# Patient Record
Sex: Male | Born: 1990 | Hispanic: Yes | Marital: Married | Smoking: Former smoker
Health system: Southern US, Community
[De-identification: ages and names within clinical notes are randomized; demographics above are authoritative.]

---
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
## 2010-07-20 ENCOUNTER — Emergency Department: Payer: Self-pay | Admitting: Emergency Medicine

## 2012-08-09 DIAGNOSIS — F32A Depression, unspecified: Secondary | ICD-10-CM | POA: Insufficient documentation

## 2016-01-02 ENCOUNTER — Emergency Department
Admission: EM | Admit: 2016-01-02 | Discharge: 2016-01-02 | Disposition: A | Discharge status: Home / Self Care | Payer: 59 | Attending: Emergency Medicine | Admitting: Emergency Medicine

## 2016-01-02 ENCOUNTER — Encounter: Payer: Self-pay | Admitting: Emergency Medicine

## 2016-01-02 ENCOUNTER — Emergency Department: Payer: 59

## 2016-01-02 DIAGNOSIS — F17200 Nicotine dependence, unspecified, uncomplicated: Secondary | ICD-10-CM | POA: Insufficient documentation

## 2016-01-02 DIAGNOSIS — R0789 Other chest pain: Secondary | ICD-10-CM | POA: Insufficient documentation

## 2016-01-02 DIAGNOSIS — R51 Headache: Secondary | ICD-10-CM | POA: Insufficient documentation

## 2016-01-02 DIAGNOSIS — R519 Headache, unspecified: Secondary | ICD-10-CM

## 2016-01-02 DIAGNOSIS — R079 Chest pain, unspecified: Secondary | ICD-10-CM

## 2016-01-02 LAB — CBC
HCT: 46.2 % (ref 40.0–52.0)
Hemoglobin: 15.3 g/dL (ref 13.0–18.0)
MCH: 30.4 pg (ref 26.0–34.0)
MCHC: 33.1 g/dL (ref 32.0–36.0)
MCV: 91.9 fL (ref 80.0–100.0)
Platelets: 248 10*3/uL (ref 150–440)
RBC: 5.02 MIL/uL (ref 4.40–5.90)
RDW: 13.5 % (ref 11.5–14.5)
WBC: 7 10*3/uL (ref 3.8–10.6)

## 2016-01-02 LAB — BASIC METABOLIC PANEL
ANION GAP: 9 (ref 5–15)
BUN: 16 mg/dL (ref 6–20)
CHLORIDE: 102 mmol/L (ref 101–111)
CO2: 28 mmol/L (ref 22–32)
CREATININE: 1.08 mg/dL (ref 0.61–1.24)
Calcium: 9.8 mg/dL (ref 8.9–10.3)
GFR calc non Af Amer: >60 mL/min (ref >60)
Glucose, Bld: 100 mg/dL — ABNORMAL HIGH (ref 65–99)
Potassium: 3.8 mmol/L (ref 3.5–5.1)
Sodium: 139 mmol/L (ref 135–145)

## 2016-01-02 LAB — TROPONIN I

## 2016-01-02 MED ORDER — IBUPROFEN 600 MG PO TABS: 600.0000 mg | ORAL_TABLET | Freq: Three times a day (TID) | ORAL | Status: DC | PRN

## 2016-01-02 MED ORDER — KETOROLAC TROMETHAMINE 60 MG/2ML IM SOLN
60.0000 mg | Freq: Once | INTRAMUSCULAR | Status: AC
  Administered 2016-01-02: 60 mg via INTRAMUSCULAR
  Filled 2016-01-02: qty 2

## 2016-01-02 NOTE — ED Notes (Addendum)
Pt states headache since Sunday, neck pain, and chest pain when he breaths that began yesterday. Denies fever, N&V, diarrhea. Pt states he has been congested recently, been treated for a sinus infection. Family states pt does not take any medicine for allergies.

## 2016-01-02 NOTE — ED Provider Notes (Signed)
[facility] Emergency Department Provider Note    ____________________________________________  Time seen: ~1955  I have reviewed the triage vital signs and the nursing notes.   HISTORY  Chief Complaint Chest Pain   History limited by: Not Limited   HPI Shawn Stevenson is a 25 y.o. male presents to the emergency department today because of concerns for headache and chest pain. Patient states he's been having a headache for the past 3-4 days. He describes as being located in bowel or back and top of his head. It is sharp. He has not tried any medication for his headache. He states that today he started noticing he was having some pain in his chest. He states it is worse when he takes a deep breath. He states that he has not had any fevers. No nausea or vomiting. No associated blurred vision.   History reviewed. No pertinent past medical history.  There are no active problems to display for this patient.   History reviewed. No pertinent past surgical history.  No current outpatient prescriptions on file.  Allergies Review of patient's allergies indicates no known allergies.  No family history on file.  Social History Social History  Substance Use Topics  . Smoking status: Current Some Day Smoker  . Smokeless tobacco: None  . Alcohol Use: No    Review of Systems  Constitutional: Negative for fever. Cardiovascular: Positive for chest pain Respiratory: Negative for shortness of breath. Gastrointestinal: Negative for abdominal pain, vomiting and diarrhea. Neurological: Positive for headache  10-point ROS otherwise negative.  ____________________________________________   PHYSICAL EXAM:  VITAL SIGNS: ED Triage Vitals  Enc Vitals Group     BP 01/02/16 1735 171/83 mmHg     Pulse Rate 01/02/16 1735 62     Resp 01/02/16 1735 20     Temp 01/02/16 1735 98.5 F (36.9 C)     Temp Source 01/02/16 1735 Oral     SpO2 01/02/16 1735 100 %      Weight 01/02/16 1735 195 lb (88.451 kg)     Height 01/02/16 1735 5\' 10"  (1.778 m)     Head Cir --      Peak Flow --      Pain Score 01/02/16 1736 7   Constitutional: Alert and oriented. Well appearing and in no distress. Eyes: Conjunctivae are normal. PERRL. Normal extraocular movements. ENT   Head: Normocephalic and atraumatic.   Nose: No congestion/rhinnorhea.   Mouth/Throat: Mucous membranes are moist.   Neck: No stridor. Hematological/Lymphatic/Immunilogical: No cervical lymphadenopathy. Cardiovascular: Normal rate, regular rhythm.  No murmurs, rubs, or gallops. Respiratory: Normal respiratory effort without tachypnea nor retractions. Breath sounds are clear and equal bilaterally. No wheezes/rales/rhonchi. Gastrointestinal: Soft and nontender. No distention.  Genitourinary: Deferred Musculoskeletal: Normal range of motion in all extremities. No joint effusions.  No lower extremity tenderness nor edema. Neurologic:  Normal speech and language. No gross focal neurologic deficits are appreciated.  Skin:  Skin is warm, dry and intact. No rash noted. Psychiatric: Mood and affect are normal. Speech and behavior are normal. Patient exhibits appropriate insight and judgment.  ____________________________________________    LABS (pertinent positives/negatives)  Labs Reviewed  BASIC METABOLIC PANEL - Abnormal; Notable for the following:    Glucose, Bld 100 (*)    All other components within normal limits  CBC  TROPONIN I   ____________________________________________  EKG  I, Nance Pear, attending physician, personally viewed and interpreted this EKG  EKG Time: 1732 Rate: 62 Rhythm: normal sinus rhythm Axis:  normal Intervals: qtc 401 QRS: Incomplete RBBB ST changes: no st elevation Impression: abnormal ekg ____________________________________________    RADIOLOGY  CXR  IMPRESSION: Negative. No active cardiopulmonary  disease.  ____________________________________________   PROCEDURES  Procedure(s) performed: None  Critical Care performed: No  ____________________________________________   INITIAL IMPRESSION / ASSESSMENT AND PLAN / ED COURSE  Pertinent labs & imaging results that were available during my care of the patient were reviewed by me and considered in my medical decision making (see chart for details).  Presented to the emergency department today primarily because of concerns for headache and some chest pain. On exam no focal neuro deficits. Patient had an on concerning EKG. Additionally blood work and chest x-ray without concerning findings. Did feel better after pain medication. At this point unclear etiology of the headache although I doubt an intracranial abscess, bleed or mass. However I did offer to perform head CT although patient declined at this time. I think this is reasonable given the lack of neuro symptoms. Additionally do not think chest pain represents ACS at this point. Will discharge and have patient follow-up with primary care.  ____________________________________________   FINAL CLINICAL IMPRESSION(S) / ED DIAGNOSES  Final diagnoses:  Headache, unspecified headache type  Chest pain, unspecified chest pain type     Nance Pear, MD 01/02/16 2117

## 2016-01-02 NOTE — ED Notes (Signed)
Pt presents with midsternal chest pain started today while at work. Denies any shortness of breath. Pt states is under a lot of stress.

## 2016-01-02 NOTE — Discharge Instructions (Signed)
Please seek medical attention for any high fevers, chest pain, shortness of breath, change in behavior, persistent vomiting, bloody stool or any other new or concerning symptoms. ° ° °General Headache Without Cause °A headache is pain or discomfort felt around the head or neck area. There are many causes and types of headaches. In some cases, the cause may not be found.  °HOME CARE  °Managing Pain °· Take over-the-counter and prescription medicines only as told by your doctor. °· Lie down in a dark, quiet room when you have a headache. °· If directed, apply ice to the head and neck area: °¨ Put ice in a plastic bag. °¨ Place a towel between your skin and the bag. °¨ Leave the ice on for 20 minutes, 2-3 times per day. °· Use a heating pad or hot shower to apply heat to the head and neck area as told by your doctor. °· Keep lights dim if bright lights bother you or make your headaches worse. °Eating and Drinking °· Eat meals on a regular schedule. °· Lessen how much alcohol you drink. °· Lessen how much caffeine you drink, or stop drinking caffeine. °General Instructions °· Keep all follow-up visits as told by your doctor. This is important. °· Keep a journal to find out if certain things bring on headaches. For example, write down: °¨ What you eat and drink. °¨ How much sleep you get. °¨ Any change to your diet or medicines. °· Relax by getting a massage or doing other relaxing activities. °· Lessen stress. °· Sit up straight. Do not tighten (tense) your muscles. °· Do not use tobacco products. This includes cigarettes, chewing tobacco, or e-cigarettes. If you need help quitting, ask your doctor. °· Exercise regularly as told by your doctor. °· Get enough sleep. This often means 7-9 hours of sleep. °GET HELP IF: °· Your symptoms are not helped by medicine. °· You have a headache that feels different than the other headaches. °· You feel sick to your stomach (nauseous) or you throw up (vomit). °· You have a  fever. °GET HELP RIGHT AWAY IF:  °· Your headache becomes really bad. °· You keep throwing up. °· You have a stiff neck. °· You have trouble seeing. °· You have trouble speaking. °· You have pain in the eye or ear. °· Your muscles are weak or you lose muscle control. °· You lose your balance or have trouble walking. °· You feel like you will pass out (faint) or you pass out. °· You have confusion. °  °This information is not intended to replace advice given to you by your health care provider. Make sure you discuss any questions you have with your health care provider. °  °Document Released: 05/19/2008 Document Revised: 05/01/2015 Document Reviewed: 12/03/2014 °Elsevier Interactive Patient Education ©2016 [employer] ° °

## 2016-10-26 IMAGING — CR DG CHEST 2V
1 series · 2 of 2 positions shown · non-contrast
Comparison: None.

CLINICAL DATA: Left-sided chest pain with inspiration. Shortness of
breath. Recent sinusitis.

EXAM:
CHEST  2 VIEW

[Series 1: w chest pa · 0.14mm/px · 2 of 2 slices shown]
[im 1/2]
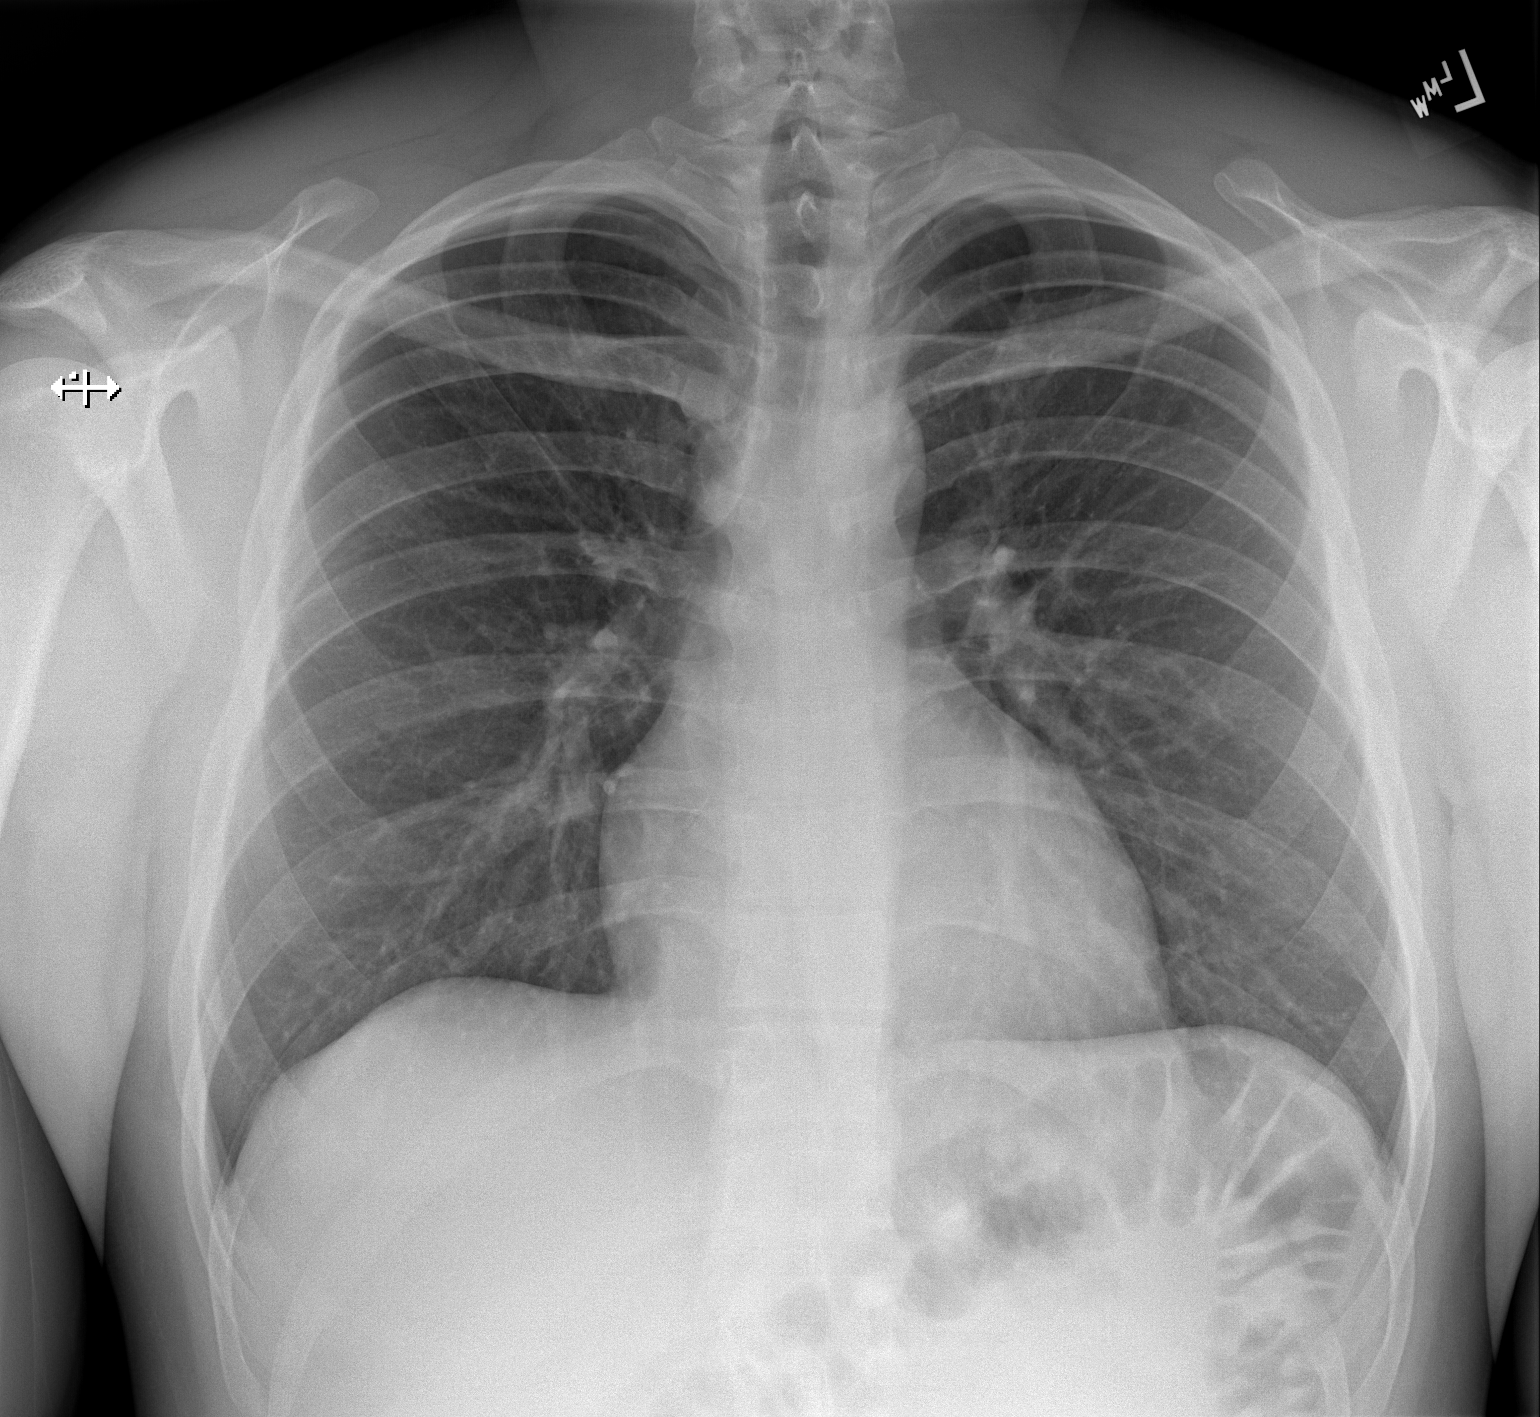
[im 2/2]
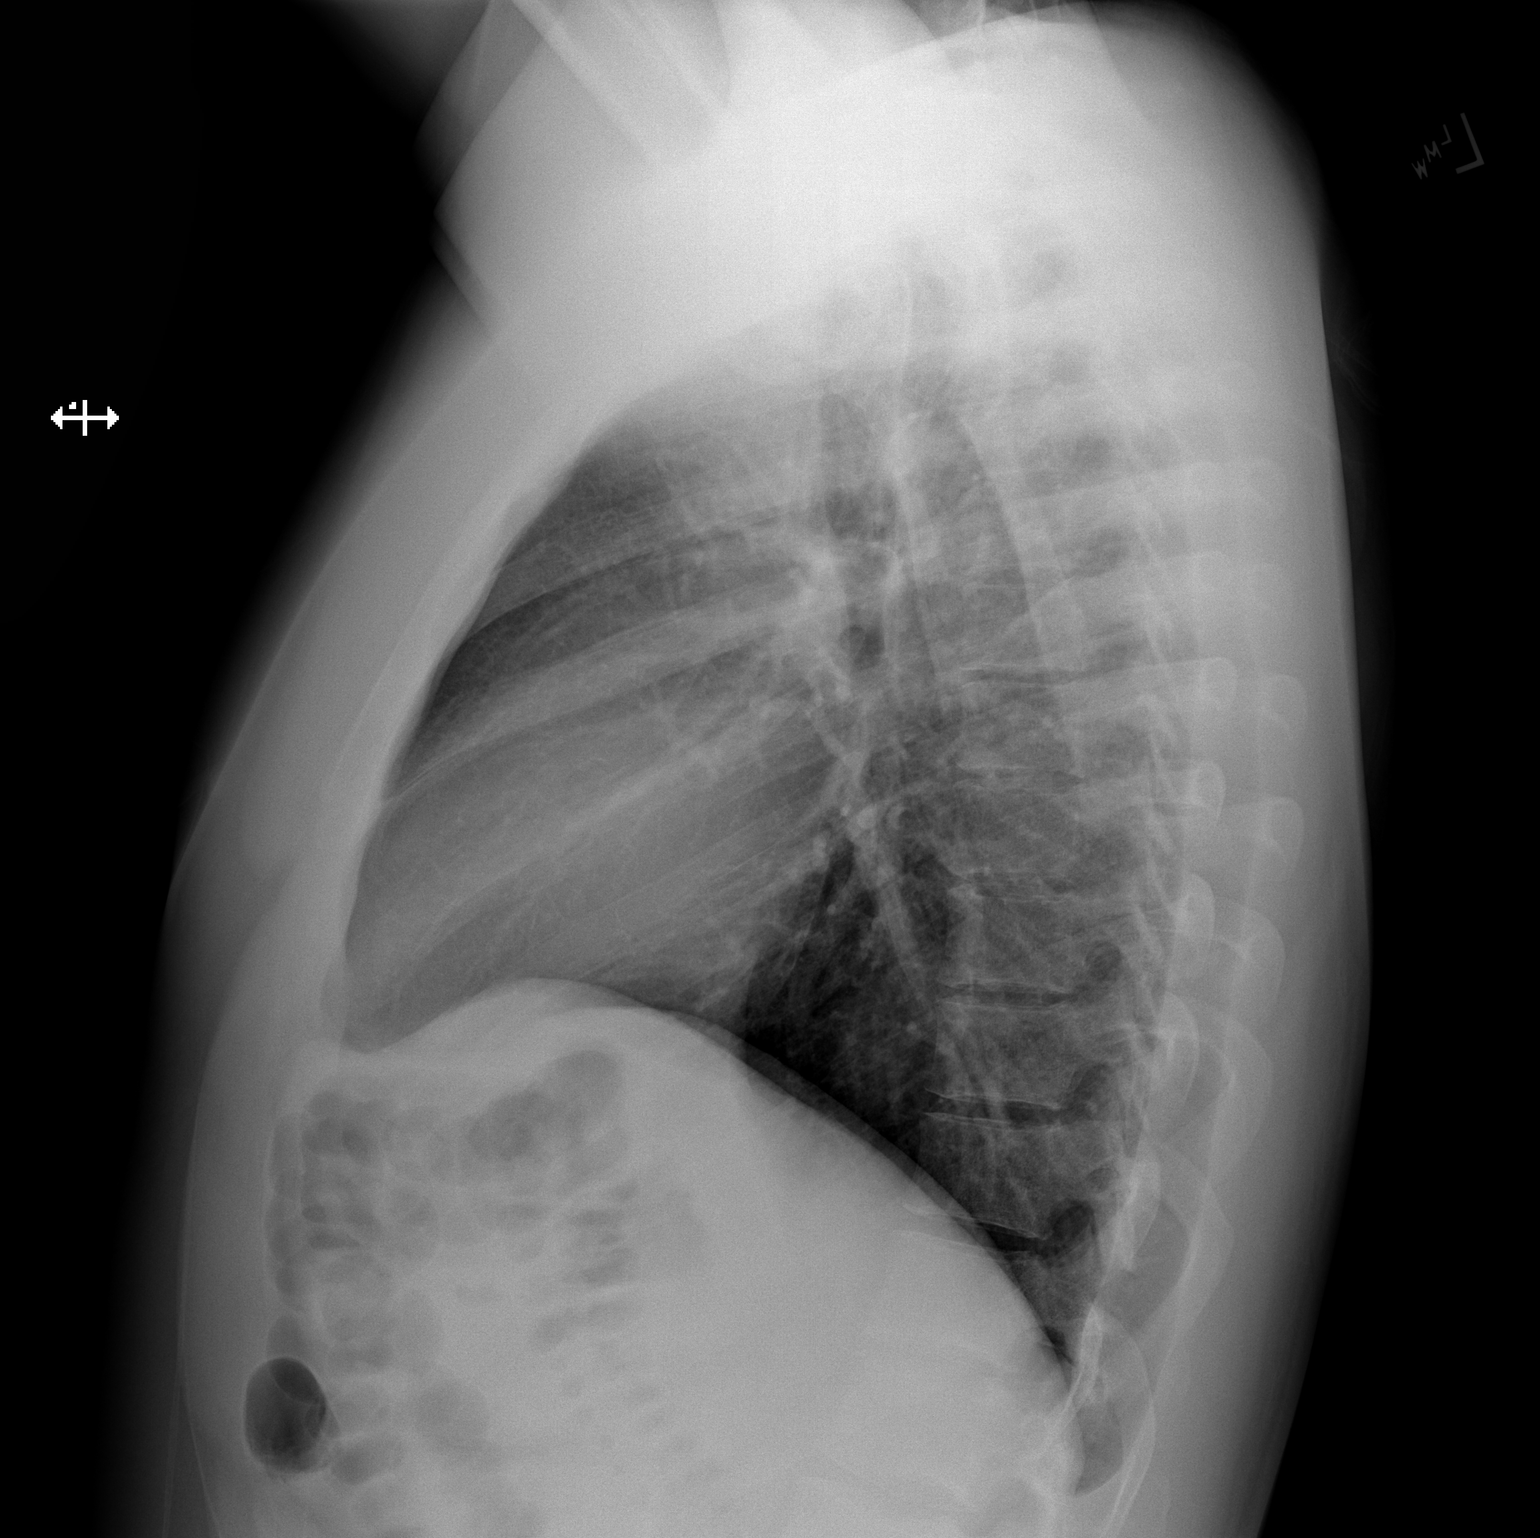

[2 of 2 positions shown; findings below may reference images not displayed]

FINDINGS: The heart size and mediastinal contours are within normal limits.
Both lungs are clear. The visualized skeletal structures are
unremarkable.
IMPRESSION: Negative.  No active cardiopulmonary disease.

## 2020-02-15 DIAGNOSIS — J36 Peritonsillar abscess: Secondary | ICD-10-CM | POA: Insufficient documentation

## 2021-09-08 ENCOUNTER — Emergency Department (HOSPITAL_COMMUNITY)
Admission: EM | Admit: 2021-09-08 | Discharge: 2021-09-08 | Discharge status: Against Medical Advice | Payer: 59 | Source: Home / Self Care

## 2021-10-23 ENCOUNTER — Emergency Department
Admission: EM | Admit: 2021-10-23 | Discharge: 2021-10-23 | Disposition: A | Discharge status: Home / Self Care | Payer: BC Managed Care – PPO | Attending: Emergency Medicine | Admitting: Emergency Medicine

## 2021-10-23 ENCOUNTER — Encounter: Payer: Self-pay | Admitting: Emergency Medicine

## 2021-10-23 ENCOUNTER — Emergency Department: Payer: BC Managed Care – PPO

## 2021-10-23 ENCOUNTER — Other Ambulatory Visit: Payer: Self-pay

## 2021-10-23 DIAGNOSIS — R059 Cough, unspecified: Secondary | ICD-10-CM | POA: Diagnosis present

## 2021-10-23 DIAGNOSIS — U071 COVID-19: Secondary | ICD-10-CM | POA: Insufficient documentation

## 2021-10-23 DIAGNOSIS — R112 Nausea with vomiting, unspecified: Secondary | ICD-10-CM | POA: Diagnosis not present

## 2021-10-23 DIAGNOSIS — R1011 Right upper quadrant pain: Secondary | ICD-10-CM | POA: Insufficient documentation

## 2021-10-23 LAB — URINALYSIS, ROUTINE W REFLEX MICROSCOPIC
Bilirubin Urine: NEGATIVE
Glucose, UA: NEGATIVE mg/dL
Ketones, ur: NEGATIVE mg/dL
Leukocytes,Ua: NEGATIVE
Nitrite: NEGATIVE
Protein, ur: 30 mg/dL — AB
Specific Gravity, Urine: 1.026 (ref 1.005–1.030)
pH: 6 (ref 5.0–8.0)

## 2021-10-23 LAB — COMPREHENSIVE METABOLIC PANEL
ALT: 46 U/L — ABNORMAL HIGH (ref 0–44)
AST: 32 U/L (ref 15–41)
Albumin: 4.8 g/dL (ref 3.5–5.0)
Alkaline Phosphatase: 80 U/L (ref 38–126)
Anion gap: 9 (ref 5–15)
BUN: 13 mg/dL (ref 6–20)
CO2: 26 mmol/L (ref 22–32)
Calcium: 9.7 mg/dL (ref 8.9–10.3)
Chloride: 101 mmol/L (ref 98–111)
Creatinine, Ser: 1.01 mg/dL (ref 0.61–1.24)
GFR, Estimated: >60 mL/min (ref >60)
Glucose, Bld: 111 mg/dL — ABNORMAL HIGH (ref 70–99)
Potassium: 3.9 mmol/L (ref 3.5–5.1)
Sodium: 136 mmol/L (ref 135–145)
Total Bilirubin: 1.4 mg/dL — ABNORMAL HIGH (ref 0.3–1.2)
Total Protein: 8.3 g/dL — ABNORMAL HIGH (ref 6.5–8.1)

## 2021-10-23 LAB — RESP PANEL BY RT-PCR (FLU A&B, COVID) ARPGX2
Influenza A by PCR: NEGATIVE
Influenza B by PCR: NEGATIVE
SARS Coronavirus 2 by RT PCR: POSITIVE — AB

## 2021-10-23 LAB — CBC
HCT: 50.2 % (ref 39.0–52.0)
Hemoglobin: 16.8 g/dL (ref 13.0–17.0)
MCH: 30.7 pg (ref 26.0–34.0)
MCHC: 33.5 g/dL (ref 30.0–36.0)
MCV: 91.8 fL (ref 80.0–100.0)
Platelets: 266 10*3/uL (ref 150–400)
RBC: 5.47 MIL/uL (ref 4.22–5.81)
RDW: 13.1 % (ref 11.5–15.5)
WBC: 8.1 10*3/uL (ref 4.0–10.5)
nRBC: 0 % (ref 0.0–0.2)

## 2021-10-23 LAB — LIPASE, BLOOD: Lipase: 35 U/L (ref 11–51)

## 2021-10-23 MED ORDER — ACETAMINOPHEN 500 MG PO TABS: 1000.0000 mg | ORAL_TABLET | Freq: Once | ORAL | Status: DC

## 2021-10-23 MED ORDER — ONDANSETRON HCL 4 MG/2ML IJ SOLN
4.0000 mg | Freq: Once | INTRAMUSCULAR | Status: AC
  Administered 2021-10-23: 4 mg via INTRAVENOUS
  Filled 2021-10-23: qty 2

## 2021-10-23 MED ORDER — ONDANSETRON 4 MG PO TBDP: 4.0000 mg | ORAL_TABLET | Freq: Three times a day (TID) | ORAL | 0 refills | Status: AC | PRN

## 2021-10-23 MED ORDER — SODIUM CHLORIDE 0.9 % IV BOLUS
1000.0000 mL | Freq: Once | INTRAVENOUS | Status: AC
  Administered 2021-10-23: 1000 mL via INTRAVENOUS

## 2021-10-23 MED ORDER — KETOROLAC TROMETHAMINE 15 MG/ML IJ SOLN
15.0000 mg | Freq: Once | INTRAMUSCULAR | Status: AC
  Administered 2021-10-23: 15 mg via INTRAVENOUS
  Filled 2021-10-23: qty 1

## 2021-10-23 MED ORDER — BENZONATATE 100 MG PO CAPS: 100.0000 mg | ORAL_CAPSULE | Freq: Three times a day (TID) | ORAL | 0 refills | Status: DC | PRN

## 2021-10-23 MED ORDER — IBUPROFEN 600 MG PO TABS: 600.0000 mg | ORAL_TABLET | Freq: Four times a day (QID) | ORAL | 0 refills | Status: AC | PRN

## 2021-10-23 MED ORDER — FENTANYL CITRATE PF 50 MCG/ML IJ SOSY
50.0000 ug | PREFILLED_SYRINGE | Freq: Once | INTRAMUSCULAR | Status: AC
  Administered 2021-10-23: 50 ug via INTRAVENOUS
  Filled 2021-10-23: qty 1

## 2021-10-23 NOTE — ED Notes (Signed)
See triage note  presents with low grade fever,body aches and n/v  states sx's started yesterday ?

## 2021-10-23 NOTE — ED Triage Notes (Signed)
Pt here with abd pain and vomiting since yesterday. Pt also reports weakness. Pt stable in triage, ?

## 2021-10-23 NOTE — ED Provider Notes (Signed)
? ?[facility] ?Provider Note ? ? ? Event Date/Time  ? First MD Initiated Contact with Patient 10/23/21 1150   ?  (approximate) ? ? ?History  ? ?Abdominal Pain and Nausea ? ? ?HPI ? ?Shawn Stevenson is a 31 y.o. male who is otherwise healthy has not been feeling well for the past 2 to 3 days.  He reports having nausea, vomiting, upper abdominal pain, congestion, coughing.  He reports that one of his family members has also been sick.  He denies any lower abdominal pain.  He had some decreased p.o. intake secondary to not feeling well.  He does report being COVID vaccinated. ? ?Physical Exam  ? ?Triage Vital Signs: ?ED Triage Vitals [10/23/21 1112]  ?Enc Vitals Group  ?   BP (!) 140/98  ?   Pulse Rate 99  ?   Resp 18  ?   Temp 99.5 ?F (37.5 ?C)  ?   Temp Source Oral  ?   SpO2 96 %  ?   Weight 210 lb (95.3 kg)  ?   Height 6' (1.829 m)  ?   Head Circumference   ?   Peak Flow   ?   Pain Score 8  ?   Pain Loc   ?   Pain Edu?   ?   Excl. in [place]?   ? ? ?Most recent vital signs: ?Vitals:  ? 10/23/21 1112  ?BP: (!) 140/98  ?Pulse: 99  ?Resp: 18  ?Temp: 99.5 ?F (37.5 ?C)  ?SpO2: 96%  ? ? ? ?General: Awake, no distress.  ?CV:  Good peripheral perfusion.  ?Resp:  Normal effort.  ?Abd:  No distention.  No lower abdominal tenderness.  Some mild upper abdominal tenderness ? ? ? ?ED Results / Procedures / Treatments  ? ?Labs ?(all labs ordered are listed, but only abnormal results are displayed) ?Labs Reviewed  ?COMPREHENSIVE METABOLIC PANEL - Abnormal; Notable for the following components:  ?    Result Value  ? Glucose, Bld 111 (*)   ? Total Protein 8.3 (*)   ? ALT 46 (*)   ? Total Bilirubin 1.4 (*)   ? All other components within normal limits  ?URINALYSIS, ROUTINE W REFLEX MICROSCOPIC - Abnormal; Notable for the following components:  ? Color, Urine YELLOW (*)   ? APPearance CLEAR (*)   ? Hgb urine dipstick SMALL (*)   ? Protein, ur 30 (*)   ? Bacteria, UA RARE (*)   ? All other components within normal limits   ?RESP PANEL BY RT-PCR (FLU A&B, COVID) ARPGX2  ?LIPASE, BLOOD  ?CBC  ? ?RADIOLOGY ?I have reviewed the xray personally and no pna ? ?[place] pending rads read ? ? ?PROCEDURES: ? ?Critical Care performed: No ? ?Procedures ? ? ?MEDICATIONS ORDERED IN ED: ?Medications  ?sodium chloride 0.9 % bolus 1,000 mL (has no administration in time range)  ?fentaNYL (SUBLIMAZE) injection 50 mcg (has no administration in time range)  ?ondansetron (ZOFRAN) injection 4 mg (has no administration in time range)  ?ondansetron (ZOFRAN) injection 4 mg (4 mg Intravenous Given 10/23/21 1121)  ? ? ? ?IMPRESSION / MDM / ASSESSMENT AND PLAN / ED COURSE  ?I reviewed the triage vital signs and the nursing notes. ?             ?               ? ?Differential diagnosis includes, but is not limited to, most likely viral illness but given  patient does report some upper abdominal pain also get ultrasound to make sure no evidence of cholecystitis.  Chest x-ray to evaluate for pneumonia.  Patient was given 1 L of fluid and IV Zofran to help with symptoms. ? ?CMP does show slightly elevated total bili and ALT.  He reports occasional alcohol use.  Ultrasound without evidence of gallstones.  Does have some hepatic steatosis which is most likely the cause of this.  Recommended follow-up with his primary care doctor for recheck and he expressed understanding and to limit alcohol use and weight loss. ? ?COVID test was positive.  CBC normal.  Lipase normal ? ?On repeat evaluation patient feeling much better.  Ultrasound with some incidental findings but I provided a copy of report to get follow-up they expressed understanding. ? ?I explained the patient was COVID-positive and most likely cause of his symptoms.  We discussed Paxlovid treatment but given otherwise young and healthy they have declined.  We discussed symptomatic treatment with Zofran, ibuprofen, Tessalon Perles.  They feel comfortable with discharge home and will return if symptoms are  worsening ? ? ?FINAL CLINICAL IMPRESSION(S) / ED DIAGNOSES  ? ?Final diagnoses:  ?RUQ pain  ?COVID-19  ? ? ? ?Rx / DC Orders  ? ?ED Discharge Orders   ? ?      Ordered  ?  benzonatate (TESSALON PERLES) 100 MG capsule  3 times daily PRN       ? 10/23/21 1425  ?  ondansetron (ZOFRAN-ODT) 4 MG disintegrating tablet  Every 8 hours PRN       ? 10/23/21 1425  ?  ibuprofen (ADVIL) 600 MG tablet  Every 6 hours PRN       ? 10/23/21 1425  ? ?  ?  ? ?  ? ? ? ?Note:  This document was prepared using Dragon voice recognition software and may include unintentional dictation errors. ?  ?Vanessa Frederick, MD ?10/23/21 1427 ? ?

## 2021-10-23 NOTE — Discharge Instructions (Addendum)
Drink Pedialyte, Gatorade without sugar to help stay hydrated.  Take medications to help with symptoms and return to the ER for worsening pain or any other concerns ? ?IMPRESSION:  ?Probable hepatic steatosis.  2.1 cm septated right hepatic cyst.  ?   ?7 mm gallbladder polyp is noted. Follow-up ultrasound in 1 year is  ?recommended to ensure stability.  ?   ? ?

## 2022-02-10 ENCOUNTER — Encounter: Payer: Self-pay | Admitting: Emergency Medicine

## 2022-02-10 ENCOUNTER — Emergency Department
Admission: EM | Admit: 2022-02-10 | Discharge: 2022-02-10 | Disposition: A | Discharge status: Home / Self Care | Payer: BC Managed Care – PPO | Attending: Emergency Medicine | Admitting: Emergency Medicine

## 2022-02-10 ENCOUNTER — Other Ambulatory Visit: Payer: Self-pay

## 2022-02-10 ENCOUNTER — Emergency Department: Payer: BC Managed Care – PPO

## 2022-02-10 DIAGNOSIS — K625 Hemorrhage of anus and rectum: Secondary | ICD-10-CM | POA: Insufficient documentation

## 2022-02-10 DIAGNOSIS — R5383 Other fatigue: Secondary | ICD-10-CM | POA: Diagnosis not present

## 2022-02-10 DIAGNOSIS — N281 Cyst of kidney, acquired: Secondary | ICD-10-CM | POA: Diagnosis not present

## 2022-02-10 DIAGNOSIS — R1013 Epigastric pain: Secondary | ICD-10-CM | POA: Diagnosis present

## 2022-02-10 DIAGNOSIS — R1084 Generalized abdominal pain: Secondary | ICD-10-CM

## 2022-02-10 LAB — CBC WITH DIFFERENTIAL/PLATELET
Abs Immature Granulocytes: 0.03 10*3/uL (ref 0.00–0.07)
Basophils Absolute: 0.1 10*3/uL (ref 0.0–0.1)
Basophils Relative: 1 %
Eosinophils Absolute: 0.1 10*3/uL (ref 0.0–0.5)
Eosinophils Relative: 2 %
HCT: 50.2 % (ref 39.0–52.0)
Hemoglobin: 16.4 g/dL (ref 13.0–17.0)
Immature Granulocytes: 0 %
Lymphocytes Relative: 41 %
Lymphs Abs: 3 10*3/uL (ref 0.7–4.0)
MCH: 30.2 pg (ref 26.0–34.0)
MCHC: 32.7 g/dL (ref 30.0–36.0)
MCV: 92.4 fL (ref 80.0–100.0)
Monocytes Absolute: 0.5 10*3/uL (ref 0.1–1.0)
Monocytes Relative: 7 %
Neutro Abs: 3.7 10*3/uL (ref 1.7–7.7)
Neutrophils Relative %: 49 %
Platelets: 291 10*3/uL (ref 150–400)
RBC: 5.43 MIL/uL (ref 4.22–5.81)
RDW: 13 % (ref 11.5–15.5)
WBC: 7.5 10*3/uL (ref 4.0–10.5)
nRBC: 0 % (ref 0.0–0.2)

## 2022-02-10 LAB — COMPREHENSIVE METABOLIC PANEL
ALT: 51 U/L — ABNORMAL HIGH (ref 0–44)
AST: 30 U/L (ref 15–41)
Albumin: 4.4 g/dL (ref 3.5–5.0)
Alkaline Phosphatase: 67 U/L (ref 38–126)
Anion gap: 6 (ref 5–15)
BUN: 13 mg/dL (ref 6–20)
CO2: 29 mmol/L (ref 22–32)
Calcium: 9.7 mg/dL (ref 8.9–10.3)
Chloride: 105 mmol/L (ref 98–111)
Creatinine, Ser: 0.91 mg/dL (ref 0.61–1.24)
GFR, Estimated: >60 mL/min (ref >60)
Glucose, Bld: 111 mg/dL — ABNORMAL HIGH (ref 70–99)
Potassium: 4.7 mmol/L (ref 3.5–5.1)
Sodium: 140 mmol/L (ref 135–145)
Total Bilirubin: 1.2 mg/dL (ref 0.3–1.2)
Total Protein: 7.4 g/dL (ref 6.5–8.1)

## 2022-02-10 LAB — TYPE AND SCREEN
ABO/RH(D): A POS
Antibody Screen: NEGATIVE

## 2022-02-10 MED ORDER — IOHEXOL 300 MG/ML  SOLN
100.0000 mL | Freq: Once | INTRAMUSCULAR | Status: AC | PRN
Start: 2022-02-10 — End: 2022-02-10
  Administered 2022-02-10: 100 mL via INTRAVENOUS

## 2022-02-10 NOTE — ED Triage Notes (Signed)
Pt here with abd pain and a GI bleed x3 weeks. Pt states his symptoms became worse yesterday when he noticed bright red blood. Pt had 1 episode of emesis this morning.

## 2022-02-10 NOTE — ED Provider Notes (Signed)
[facility] Provider Note    Event Date/Time   First MD Initiated Contact with Patient 02/10/22 1227     (approximate)   History   Abdominal Pain   HPI  Shawn Stevenson is a 31 y.o. male who presents today for evaluation of epigastric pain and bright red blood per rectum.  Patient was initially seen at [facility] clinic and sent him to the emergency department for evaluation.  Patient reports that he has blood in his stool intermittently for the past 3 weeks, only when wiping.  He estimates about a teaspoon of bright red blood.  He reports that it is usually mixed in with the stool.  He reports that he has pain in his abdomen that he rates 6 out of 10.  He reports that he has been feeling fatigued as well.  He denies any pain when moving his bowels.  He has not had any fevers or chills.  No personal or family history of GI problems or colon cancer.  Patient reports that he has been taking Pepto-Bismol.  There are no problems to display for this patient.         Physical Exam   Triage Vital Signs: ED Triage Vitals [02/10/22 1122]  Enc Vitals Group     BP (!) 160/105     Pulse Rate (!) 57     Resp 18     Temp 98.2 F (36.8 C)     Temp Source Oral     SpO2 98 %     Weight 236 lb (107 kg)     Height '5\' 11"'$  (1.803 m)     Head Circumference      Peak Flow      Pain Score 7     Pain Loc      Pain Edu?      Excl. in [place]?     Most recent vital signs: Vitals:   02/10/22 1236 02/10/22 1456  BP: (!) 150/99 (!) 148/88  Pulse: 60 67  Resp: 18 18  Temp:    SpO2: 98% 98%    Physical Exam Vitals and nursing note reviewed.  Constitutional:      General: Awake and alert. No acute distress.    Appearance: Normal appearance. He is well-developed and normal weight.  HENT:     Head: Normocephalic and atraumatic.     Mouth: Mucous membranes are moist.  Eyes:     General: PERRL. Normal EOMs        Right eye: No discharge.        Left eye: No discharge.      Conjunctiva/sclera: Conjunctivae normal.  Cardiovascular:     Rate and Rhythm: Normal rate and regular rhythm.     Pulses: Normal pulses.     Heart sounds: Normal heart sounds Pulmonary:     Effort: Pulmonary effort is normal. No respiratory distress.     Breath sounds: Normal breath sounds.  Abdominal:     Abdomen is soft. There is mild periumbilical, lower quadrant, and right lower quadrant abdominal tenderness. No rebound or guarding. No distention. Rectal: Scant stool in rectal vault.  No hemorrhoids noted.  Guaiac negative Musculoskeletal:        General: No swelling. Normal range of motion.     Cervical back: Normal range of motion and neck supple.  Skin:    General: Skin is warm and dry.     Capillary Refill: Capillary refill takes less than 2 seconds.  Findings: No rash.  Neurological:     Mental Status: He is alert.      ED Results / Procedures / Treatments   Labs (all labs ordered are listed, but only abnormal results are displayed) Labs Reviewed  COMPREHENSIVE METABOLIC PANEL - Abnormal; Notable for the following components:      Result Value   Glucose, Bld 111 (*)    ALT 51 (*)    All other components within normal limits  CBC WITH DIFFERENTIAL/PLATELET  TYPE AND SCREEN     EKG     RADIOLOGY IMPRESSION: 1. No acute CT findings of the abdomen or pelvis to explain abdominal pain or bright red blood per rectum. 2. Numerous bilateral renal cysts of varying sizes, largest of which are clearly simple fluid attenuation, others too small to confidently characterize although almost certainly additional benign cysts. Although benign, number of cysts at patient age suggests adult polycystic kidney disease.    PROCEDURES:  Critical Care performed:   Procedures   MEDICATIONS ORDERED IN ED: Medications  iohexol (OMNIPAQUE) 300 MG/ML solution 100 mL (100 mLs Intravenous Contrast Given 02/10/22 1400)     IMPRESSION / MDM / ASSESSMENT AND PLAN / ED  COURSE  I reviewed the triage vital signs and the nursing notes.   Differential diagnosis includes, but is not limited to, GI bleed, hemorrhoids, gastroenteritis.  Patient presents emergency department awake and alert, hemodynamically stable and afebrile.  Patient has no known history of familial polyposis or other colon cancer syndromes. Labs obtained in triage demonstrate a stable H&H, normal LFTs and lipase, normal coagulation cascade.  CT abdomen and pelvis also obtained and demonstrates no obvious cause of his belly pain and bright red blood per rectum.  However incidentally, numerous bilateral renal cysts of various sizes were seen, suggesting possible adult polycystic kidney disease as interpreted by the radiologist.  I discussed these findings with the patient, he reports that he has been told that he has kidney cysts before.  I recommended that he follow-up with nephrology, and the appropriate information was provided.  Regarding his abdominal pain and his bright red blood per rectum, I recommended that he have a colonoscopy and he was given the information for gastroenterology.  Given that he has had stable vital signs, normal H&H, no indication for emergent colonoscopy.  However, I do feel that he should have this in the very near future, and he agrees to call tomorrow.  In the meantime, we did discuss strict return precautions.  Patient and wife understand and agree with plan.  He was discharged in stable condition.    Patient's presentation is most consistent with acute presentation with potential threat to life or bodily function.       FINAL CLINICAL IMPRESSION(S) / ED DIAGNOSES   Final diagnoses:  Generalized abdominal pain  BRBPR (bright red blood per rectum)  Bilateral renal cysts     Rx / DC Orders   ED Discharge Orders     None        Note:  This document was prepared using Dragon voice recognition software and may include unintentional dictation errors.   Emeline Gins 02/10/22 Kerrie Pleasure, MD 02/11/22 [phone]

## 2022-02-10 NOTE — Discharge Instructions (Signed)
Your blood work and CT imaging did not show any causes of your GI bleeding today.  As we discussed, you do have cysts on your kidneys.  Please follow-up with the phone numbers above to schedule a colonoscopy which be the next step for your GI bleeding, and please schedule an appointment with the nephrologist for the cysts on your kidneys.  Please return the emergency department immediately for any new, worsening, or change in symptoms or other concerns.  It was a pleasure caring for you today.

## 2022-02-26 ENCOUNTER — Other Ambulatory Visit: Payer: Self-pay

## 2022-02-26 ENCOUNTER — Encounter: Payer: Self-pay | Admitting: Gastroenterology

## 2022-02-26 ENCOUNTER — Ambulatory Visit (INDEPENDENT_AMBULATORY_CARE_PROVIDER_SITE_OTHER): Payer: BC Managed Care – PPO | Admitting: Gastroenterology

## 2022-02-26 VITALS — BP 165/103 | HR 73 | Temp 98.3°F | Ht 72.0 in | Wt 225.4 lb

## 2022-02-26 DIAGNOSIS — R1084 Generalized abdominal pain: Secondary | ICD-10-CM

## 2022-02-26 DIAGNOSIS — K625 Hemorrhage of anus and rectum: Secondary | ICD-10-CM | POA: Diagnosis not present

## 2022-02-26 MED ORDER — OMEPRAZOLE 40 MG PO CPDR: 40.0000 mg | DELAYED_RELEASE_CAPSULE | Freq: Two times a day (BID) | ORAL | 0 refills | Status: AC

## 2022-02-26 MED ORDER — NA SULFATE-K SULFATE-MG SULF 17.5-3.13-1.6 GM/177ML PO SOLN: 354.0000 mL | Freq: Once | ORAL | 0 refills | Status: AC

## 2022-02-26 NOTE — Progress Notes (Signed)
Jonathon Bellows MD, MRCP(U.K) [address]  [place]  [place], [place] [address]  Main: [phone]  Fax: [phone]   Gastroenterology Consultation  Referring Provider:     [facility] Primary Care Physician:  [facility] Primary Gastroenterologist:  Dr. Jonathon Bellows  Reason for Consultation:     Abdominal pain        HPI:   Shawn Stevenson is a 31 y.o. y/o male referred for abdominal pain  Recent visit to the ER on 02/10/2022 with abdominal pain ongoing along with bright red blood per rectum only when wiping a CT scan of the abdomen showed bilateral renal cysts of various sizes suggestive of adult polycystic kidney disease otherwise no acute findings advised to follow-up with gastroenterology his hemoglobin was 16.4 g with a CMP showed no abnormalities except an elevated sugar of 111.  For a few weeks has been having bright red blood per rectum every time he has a bowel movement.  No pain.  No NSAID use.  No joint pains no skin rashes no family history of IBD.  Upper abdominal discomfort when he eats.  Right after he eats.  Denies any NSAID use.  Not tried a PPI.  No unintentional weight loss.  No prior GI evaluation.  No past medical history on file.  No past surgical history on file.  Prior to Admission medications   Medication Sig Start Date End Date Taking? Authorizing Provider  benzonatate (TESSALON PERLES) 100 MG capsule Take 1 capsule (100 mg total) by mouth 3 (three) times daily as needed for cough. 10/23/21 10/23/22  Vanessa Port Colden, MD  citalopram (CELEXA) 20 MG tablet Take 1 tablet by mouth daily. 02/16/22 02/16/23  [provider]    No family history on file.   Social History   Tobacco Use   Smoking status: Some Days  Vaping Use   Vaping Use: Never used  Substance Use Topics   Alcohol use: No   Drug use: Never    Allergies as of 02/26/2022 - Review Complete 02/26/2022  Allergen Reaction Noted   Other  01/02/2016    Review  of Systems:    All systems reviewed and negative except where noted in HPI.   Physical Exam:  BP (!) 165/103   Pulse 73   Temp 98.3 F (36.8 C) (Oral)   Ht 6' (1.829 m)   Wt 225 lb 6.4 oz (102.2 kg)   BMI 30.57 kg/m  No LMP for male patient. Psych:  Alert and cooperative. Normal mood and affect. General:   Alert,  Well-developed, well-nourished, pleasant and cooperative in NAD Head:  Normocephalic and atraumatic. Eyes:  Sclera clear, no icterus.   Conjunctiva pink. Neurologic:  Alert and oriented x3;  grossly normal neurologically. Psych:  Alert and cooperative. Normal mood and affect.  Imaging Studies: CT ABDOMEN PELVIS W CONTRAST  Result Date: 02/10/2022 CLINICAL DATA:  Abdominal pain, bright red blood per rectum for 3 weeks EXAM: CT ABDOMEN AND PELVIS WITH CONTRAST TECHNIQUE: Multidetector CT imaging of the abdomen and pelvis was performed using the standard protocol following bolus administration of intravenous contrast. RADIATION DOSE REDUCTION: This exam was performed according to the departmental dose-optimization program which includes automated exposure control, adjustment of the mA and/or kV according to patient size and/or use of iterative reconstruction technique. CONTRAST:  156m OMNIPAQUE IOHEXOL 300 MG/ML  SOLN COMPARISON:  None Available. FINDINGS: Lower chest: No acute abnormality. Hepatobiliary: No solid liver abnormality is seen. Small  liver lesions, largest in the right lobe clearly a simple cyst, others too small to characterize although almost certainly additional subcentimeter cysts. No further follow-up or characterization is required. No gallstones, gallbladder wall thickening, or biliary dilatation. Pancreas: Unremarkable. No pancreatic ductal dilatation or surrounding inflammatory changes. Spleen: Normal in size without significant abnormality. Adrenals/Urinary Tract: Adrenal glands are unremarkable. Numerous bilateral renal cysts of varying sizes, largest of which  are clearly simple fluid attenuation, others too small to confidently characterize although almost certainly additional benign cysts. No further follow-up or characterization is required. Kidneys are otherwise normal, without renal calculi, solid lesion, or hydronephrosis. Bladder is unremarkable. Stomach/Bowel: Stomach is within normal limits. Appendix appears normal. No evidence of bowel wall thickening, distention, or inflammatory changes. Vascular/Lymphatic: No significant vascular findings are present. No enlarged abdominal or pelvic lymph nodes. Reproductive: No mass or other significant abnormality. Other: No abdominal wall hernia or abnormality. No ascites. Musculoskeletal: No acute or significant osseous findings. IMPRESSION: 1. No acute CT findings of the abdomen or pelvis to explain abdominal pain or bright red blood per rectum. 2. Numerous bilateral renal cysts of varying sizes, largest of which are clearly simple fluid attenuation, others too small to confidently characterize although almost certainly additional benign cysts. Although benign, number of cysts at patient age suggests adult polycystic kidney disease. Electronically Signed   By: Delanna Ahmadi M.D.   On: 02/10/2022 14:24    Assessment and Plan:   Shawn Stevenson is a 31 y.o. y/o male has been referred for abdominal pain and rectal bleeding recent ER visit.  Hemoglobin normal.  Suggestive of a lower GI bleed.  Differentials include IBDhemorrhoidal versus a juvenile polyp.  Also had upper GI symptoms with dyspepsia  Plan 1.  Colonoscopy plus EGD within 2 to 3 weeks 2.  H. pylori breath test 3.  Prilosec 40 mg once a day   I have discussed alternative options, risks & benefits,  which include, but are not limited to, bleeding, infection, perforation,respiratory complication & drug reaction.  The patient agrees with this plan & written consent will be obtained.     Follow up in 2 to 4 weeks  Dr Jonathon Bellows MD,MRCP(U.K)

## 2022-02-26 NOTE — Patient Instructions (Signed)
Please take Prilosec 40 MG twice a day for 3 months and then stop.

## 2022-02-27 LAB — H. PYLORI BREATH TEST: H pylori Breath Test: NEGATIVE

## 2022-03-11 ENCOUNTER — Ambulatory Visit
Admission: RE | Admit: 2022-03-11 | Discharge: 2022-03-11 | Disposition: A | Discharge status: Home / Self Care | Payer: BC Managed Care – PPO | Attending: Gastroenterology | Admitting: Gastroenterology

## 2022-03-11 ENCOUNTER — Encounter
Admission: RE | Disposition: A | Discharge status: Home / Self Care | Payer: Self-pay | Source: Home / Self Care | Attending: Gastroenterology

## 2022-03-11 ENCOUNTER — Encounter: Payer: Self-pay | Admitting: Gastroenterology

## 2022-03-11 ENCOUNTER — Ambulatory Visit: Payer: BC Managed Care – PPO | Admitting: Anesthesiology

## 2022-03-11 DIAGNOSIS — D122 Benign neoplasm of ascending colon: Secondary | ICD-10-CM | POA: Diagnosis not present

## 2022-03-11 DIAGNOSIS — K625 Hemorrhage of anus and rectum: Secondary | ICD-10-CM | POA: Insufficient documentation

## 2022-03-11 DIAGNOSIS — D120 Benign neoplasm of cecum: Secondary | ICD-10-CM | POA: Diagnosis not present

## 2022-03-11 DIAGNOSIS — F17200 Nicotine dependence, unspecified, uncomplicated: Secondary | ICD-10-CM | POA: Insufficient documentation

## 2022-03-11 DIAGNOSIS — K2950 Unspecified chronic gastritis without bleeding: Secondary | ICD-10-CM | POA: Diagnosis not present

## 2022-03-11 DIAGNOSIS — K640 First degree hemorrhoids: Secondary | ICD-10-CM | POA: Insufficient documentation

## 2022-03-11 DIAGNOSIS — R109 Unspecified abdominal pain: Secondary | ICD-10-CM | POA: Diagnosis not present

## 2022-03-11 DIAGNOSIS — D126 Benign neoplasm of colon, unspecified: Secondary | ICD-10-CM | POA: Diagnosis not present

## 2022-03-11 DIAGNOSIS — R1084 Generalized abdominal pain: Secondary | ICD-10-CM

## 2022-03-11 HISTORY — PX: COLONOSCOPY WITH PROPOFOL: SHX5780

## 2022-03-11 HISTORY — PX: ESOPHAGOGASTRODUODENOSCOPY: SHX5428

## 2022-03-11 SURGERY — COLONOSCOPY WITH PROPOFOL
Anesthesia: General

## 2022-03-11 MED ORDER — PROPOFOL 10 MG/ML IV BOLUS
INTRAVENOUS | Status: DC | PRN
  Administered 2022-03-11: 50 mg via INTRAVENOUS

## 2022-03-11 MED ORDER — DEXMEDETOMIDINE HCL IN NACL 200 MCG/50ML IV SOLN
INTRAVENOUS | Status: DC | PRN
  Administered 2022-03-11: 20 ug via INTRAVENOUS

## 2022-03-11 MED ORDER — FENTANYL CITRATE (PF) 100 MCG/2ML IJ SOLN
INTRAMUSCULAR | Status: DC | PRN
  Administered 2022-03-11 (×2): 25 ug via INTRAVENOUS
  Administered 2022-03-11: 50 ug via INTRAVENOUS

## 2022-03-11 MED ORDER — FENTANYL CITRATE (PF) 100 MCG/2ML IJ SOLN
INTRAMUSCULAR | Status: AC
  Filled 2022-03-11: qty 2

## 2022-03-11 MED ORDER — PROPOFOL 1000 MG/100ML IV EMUL
INTRAVENOUS | Status: AC
  Filled 2022-03-11: qty 100

## 2022-03-11 MED ORDER — STERILE WATER FOR IRRIGATION IR SOLN
Status: DC | PRN
  Administered 2022-03-11: 60 mL

## 2022-03-11 MED ORDER — KETOROLAC TROMETHAMINE 30 MG/ML IJ SOLN
INTRAMUSCULAR | Status: AC
  Filled 2022-03-11: qty 1

## 2022-03-11 MED ORDER — LIDOCAINE HCL (PF) 2 % IJ SOLN
INTRAMUSCULAR | Status: AC
  Filled 2022-03-11: qty 10

## 2022-03-11 MED ORDER — MIDAZOLAM HCL 2 MG/2ML IJ SOLN
INTRAMUSCULAR | Status: DC | PRN
  Administered 2022-03-11: 2 mg via INTRAVENOUS

## 2022-03-11 MED ORDER — LIDOCAINE HCL (CARDIAC) PF 100 MG/5ML IV SOSY
PREFILLED_SYRINGE | INTRAVENOUS | Status: DC | PRN
  Administered 2022-03-11: 100 mg via INTRAVENOUS

## 2022-03-11 MED ORDER — DEXMEDETOMIDINE HCL IN NACL 80 MCG/20ML IV SOLN
INTRAVENOUS | Status: AC
  Filled 2022-03-11: qty 20

## 2022-03-11 MED ORDER — PROPOFOL 500 MG/50ML IV EMUL
INTRAVENOUS | Status: DC | PRN
  Administered 2022-03-11: 150 ug/kg/min via INTRAVENOUS

## 2022-03-11 MED ORDER — SODIUM CHLORIDE 0.9 % IV SOLN
INTRAVENOUS | Status: DC
  Administered 2022-03-11: 500 mL via INTRAVENOUS

## 2022-03-11 MED ORDER — MIDAZOLAM HCL 2 MG/2ML IJ SOLN
INTRAMUSCULAR | Status: AC
  Filled 2022-03-11: qty 2

## 2022-03-11 NOTE — Op Note (Signed)
[facility] Gastroenterology Patient Name: Shawn Stevenson Procedure Date: 03/11/2022 1:09 PM MRN: [id number] Account #: [id number] Date of Birth: [birth year] Admit Type: Outpatient Age: 31 Room: [facility] ENDO ROOM 3 Gender: Male Note Status: Finalized Instrument Name: Upper Endoscope [id number] Procedure:             Upper GI endoscopy Indications:           Abdominal pain Providers:             Jonathon Bellows MD, MD Referring MD:          Wynona Canes. Kym Groom, MD (Referring MD) Medicines:             Monitored Anesthesia Care Complications:         No immediate complications. Procedure:             Pre-Anesthesia Assessment:                        - Prior to the procedure, a History and Physical was                         performed, and patient medications, allergies and                         sensitivities were reviewed. The patient's tolerance                         of previous anesthesia was reviewed.                        - The risks and benefits of the procedure and the                         sedation options and risks were discussed with the                         patient. All questions were answered and informed                         consent was obtained.                        - ASA Grade Assessment: II - A patient with mild                         systemic disease.                        After obtaining informed consent, the endoscope was                         passed under direct vision. Throughout the procedure,                         the patient's blood pressure, pulse, and oxygen                         saturations were monitored continuously. The Endoscope                         was  introduced through the mouth, and advanced to the                         third part of duodenum. The upper GI endoscopy was                         accomplished with ease. The patient tolerated the                         procedure well. Findings:      The esophagus was  normal.      The examined duodenum was normal.      The entire examined stomach was normal. Biopsies were taken with a cold       forceps for histology. Impression:            - Normal esophagus.                        - Normal examined duodenum.                        - Normal stomach. Biopsied. Recommendation:        - Await pathology results.                        - Perform a colonoscopy today. Procedure Code(s):     --- Professional ---                        [phone], Esophagogastroduodenoscopy, flexible,                         transoral; with biopsy, single or multiple Diagnosis Code(s):     --- Professional ---                        R10.9, Unspecified abdominal pain CPT copyright 2019 American Medical Association. All rights reserved. The codes documented in this report are preliminary and upon coder review may  be revised to meet current compliance requirements. Jonathon Bellows, MD Jonathon Bellows MD, MD 03/11/2022 1:28:54 PM This report has been signed electronically. Number of Addenda: 0 Note Initiated On: 03/11/2022 1:09 PM Estimated Blood Loss:  Estimated blood loss: none.      [facility]

## 2022-03-11 NOTE — H&P (Signed)
     Jonathon Bellows, MD [address] [place], [place], [place], [address] [address], [place], [place], [place], [address] Phone: [phone]  Fax: [phone]  Primary Care Physician:  Valera Castle, MD   Pre-Procedure History & Physical: HPI:  Shawn Stevenson is a 31 y.o. male is here for an endoscopy and colonoscopy    History reviewed. No pertinent past medical history.  History reviewed. No pertinent surgical history.  Prior to Admission medications   Medication Sig Start Date End Date Taking? Authorizing Provider  citalopram (CELEXA) 20 MG tablet Take 1 tablet by mouth daily. 02/16/22 02/16/23 Yes [provider]  omeprazole (PRILOSEC) 40 MG capsule Take 1 capsule (40 mg total) by mouth in the morning and at bedtime. 02/26/22 05/27/22 Yes Jonathon Bellows, MD    Allergies as of 02/26/2022 - Review Complete 02/26/2022  Allergen Reaction Noted   Other  01/02/2016    History reviewed. No pertinent family history.  Social History   Socioeconomic History   Marital status: Married    Spouse name: Not on file   Number of children: Not on file   Years of education: Not on file   Highest education level: Not on file  Occupational History   Not on file  Tobacco Use   Smoking status: Some Days   Smokeless tobacco: Never  Vaping Use   Vaping Use: Never used  Substance and Sexual Activity   Alcohol use: No   Drug use: Never   Sexual activity: Not on file  Other Topics Concern   Not on file  Social History Narrative   Not on file   Social Determinants of Health   Financial Resource Strain: Not on file  Food Insecurity: Not on file  Transportation Needs: Not on file  Physical Activity: Not on file  Stress: Not on file  Social Connections: Not on file  Intimate Partner Violence: Not on file    Review of Systems: See HPI, otherwise negative ROS  Physical Exam: BP (!) 126/91   Pulse (!) 54   Temp (!) 96.7 F (35.9 C) (Temporal)   Resp 18   Ht 6'  (1.829 m)   Wt 100 kg   SpO2 100%   BMI 29.90 kg/m  General:   Alert,  pleasant and cooperative in NAD Head:  Normocephalic and atraumatic. Neck:  Supple; no masses or thyromegaly. Lungs:  Clear throughout to auscultation, normal respiratory effort.    Heart:  +S1, +S2, Regular rate and rhythm, No edema. Abdomen:  Soft, nontender and nondistended. Normal bowel sounds, without guarding, and without rebound.   Neurologic:  Alert and  oriented x4;  grossly normal neurologically.  Impression/Plan: Shawn Stevenson is here for an endoscopy and colonoscopy  to be performed for  evaluation of abdominal pain and rectal bleeding     Risks, benefits, limitations, and alternatives regarding endoscopy have been reviewed with the patient.  Questions have been answered.  All parties agreeable.   Jonathon Bellows, MD  03/11/2022, 1:16 PM

## 2022-03-11 NOTE — Anesthesia Preprocedure Evaluation (Signed)
Anesthesia Evaluation  Patient identified by MRN, date of birth, ID band Patient awake    Reviewed: Allergy & Precautions, NPO status , Patient's Chart, lab work & pertinent test results  Airway Mallampati: III  TM Distance: >3 FB Neck ROM: full    Dental  (+) Chipped   Pulmonary neg pulmonary ROS, Current Smoker,    Pulmonary exam normal        Cardiovascular negative cardio ROS Normal cardiovascular exam     Neuro/Psych negative neurological ROS  negative psych ROS   GI/Hepatic negative GI ROS, Neg liver ROS,   Endo/Other  negative endocrine ROS  Renal/GU negative Renal ROS  negative genitourinary   Musculoskeletal   Abdominal   Peds  Hematology negative hematology ROS (+)   Anesthesia Other Findings History reviewed. No pertinent past medical history.  History reviewed. No pertinent surgical history.  BMI    Body Mass Index: 29.90 kg/m      Reproductive/Obstetrics negative OB ROS                             Anesthesia Physical Anesthesia Plan  ASA: 2  Anesthesia Plan: General   Post-op Pain Management: Minimal or no pain anticipated   Induction: Intravenous  PONV Risk Score and Plan: 3 and Propofol infusion, TIVA and Ondansetron  Airway Management Planned: Nasal Cannula  Additional Equipment: None  Intra-op Plan:   Post-operative Plan:   Informed Consent: I have reviewed the patients History and Physical, chart, labs and discussed the procedure including the risks, benefits and alternatives for the proposed anesthesia with the patient or authorized representative who has indicated his/her understanding and acceptance.     Dental advisory given  Plan Discussed with: CRNA and Surgeon  Anesthesia Plan Comments: (Discussed risks of anesthesia with patient, including possibility of difficulty with spontaneous ventilation under anesthesia necessitating airway  intervention, PONV, and rare risks such as cardiac or respiratory or neurological events, and allergic reactions. Discussed the role of CRNA in patient's perioperative care. Patient understands.)        Anesthesia Quick Evaluation

## 2022-03-11 NOTE — Anesthesia Postprocedure Evaluation (Signed)
Anesthesia Post Note  Patient: Shawn Stevenson  Procedure(s) Performed: COLONOSCOPY WITH PROPOFOL ESOPHAGOGASTRODUODENOSCOPY (EGD)  Patient location during evaluation: Endoscopy Anesthesia Type: General Level of consciousness: awake and alert Pain management: pain level controlled Vital Signs Assessment: post-procedure vital signs reviewed and stable Respiratory status: spontaneous breathing, nonlabored ventilation, respiratory function stable and patient connected to nasal cannula oxygen Cardiovascular status: blood pressure returned to baseline and stable Postop Assessment: no apparent nausea or vomiting Anesthetic complications: no   No notable events documented.   Last Vitals:  Vitals:   03/11/22 1221 03/11/22 1346  BP: (!) 126/91 (!) 101/53  Pulse: (!) 54   Resp: 18   Temp: (!) 35.9 C (!) 35.8 C  SpO2: 100%     Last Pain:  Vitals:   03/11/22 1426  TempSrc:   PainSc: 0-No pain                 Dimas Millin

## 2022-03-11 NOTE — Transfer of Care (Signed)
Immediate Anesthesia Transfer of Care Note  Patient: Shawn Stevenson  Procedure(s) Performed: COLONOSCOPY WITH PROPOFOL ESOPHAGOGASTRODUODENOSCOPY (EGD)  Patient Location: PACU  Anesthesia Type:General  Level of Consciousness: sedated  Airway & Oxygen Therapy: Patient Spontanous Breathing and Patient connected to nasal cannula oxygen  Post-op Assessment: Report given to RN and Post -op Vital signs reviewed and stable  Post vital signs: Reviewed and stable  Last Vitals:  Vitals Value Taken Time  BP 101/53 03/11/22 1346  Temp    Pulse 62 03/11/22 1348  Resp 14 03/11/22 1348  SpO2 96 % 03/11/22 1348  Vitals shown include unvalidated device data.  Last Pain:  Vitals:   03/11/22 1346  TempSrc:   PainSc: 0-No pain         Complications: No notable events documented.

## 2022-03-11 NOTE — Op Note (Signed)
[facility] Gastroenterology Patient Name: Shawn Stevenson Procedure Date: 03/11/2022 1:08 PM MRN: [id number] Account #: [id number] Date of Birth: 09-06-1990 Admit Type: Outpatient Age: 31 Room: [facility] ENDO ROOM 3 Gender: Male Note Status: Finalized Instrument Name: Jasper Riling [id number] Procedure:             Colonoscopy Indications:           Rectal bleeding Providers:             Jonathon Bellows MD, MD Referring MD:          Wynona Canes. Kym Groom, MD (Referring MD) Medicines:             Monitored Anesthesia Care Complications:         No immediate complications. Procedure:             Pre-Anesthesia Assessment:                        - Prior to the procedure, a History and Physical was                         performed, and patient medications, allergies and                         sensitivities were reviewed. The patient's tolerance                         of previous anesthesia was reviewed.                        - The risks and benefits of the procedure and the                         sedation options and risks were discussed with the                         patient. All questions were answered and informed                         consent was obtained.                        - ASA Grade Assessment: II - A patient with mild                         systemic disease.                        After obtaining informed consent, the colonoscope was                         passed under direct vision. Throughout the procedure,                         the patient's blood pressure, pulse, and oxygen                         saturations were monitored continuously. The                         Colonoscope was introduced through the  anus and                         advanced to the the cecum, identified by the                         appendiceal orifice. The colonoscopy was performed                         with ease. The patient tolerated the procedure well.                         The  quality of the bowel preparation was excellent. Findings:      The perianal and digital rectal examinations were normal.      A 4 mm polyp was found in the cecum. The polyp was sessile. The polyp       was removed with a cold biopsy forceps. Resection and retrieval were       complete.      A 5 mm polyp was found in the ascending colon. The polyp was sessile.       The polyp was removed with a cold snare. Resection and retrieval were       complete.      Non-bleeding internal hemorrhoids were found during retroflexion. The       hemorrhoids were large and Grade I (internal hemorrhoids that do not       prolapse).      The exam was otherwise without abnormality on direct and retroflexion       views. Impression:            - One 4 mm polyp in the cecum, removed with a cold                         biopsy forceps. Resected and retrieved.                        - One 5 mm polyp in the ascending colon, removed with                         a cold snare. Resected and retrieved.                        - Non-bleeding internal hemorrhoids.                        - The examination was otherwise normal on direct and                         retroflexion views. Recommendation:        - Discharge patient to home (with escort).                        - Resume previous diet.                        - Continue present medications.                        - Await pathology results.                        -  Repeat colonoscopy for surveillance based on                         pathology results.                        - Return to GI office in 1 week.                        - Hemorroids continue to bleed , will plan for banding                         at office Procedure Code(s):     --- Professional ---                        [phone], Colonoscopy, flexible; with removal of                         tumor(s), polyp(s), or other lesion(s) by snare                         technique                        [id number],  59, Colonoscopy, flexible; with biopsy, single                         or multiple Diagnosis Code(s):     --- Professional ---                        K63.5, Polyp of colon                        K64.0, First degree hemorrhoids                        K62.5, Hemorrhage of anus and rectum CPT copyright 2019 American Medical Association. All rights reserved. The codes documented in this report are preliminary and upon coder review may  be revised to meet current compliance requirements. Jonathon Bellows, MD Jonathon Bellows MD, MD 03/11/2022 1:43:44 PM This report has been signed electronically. Number of Addenda: 0 Note Initiated On: 03/11/2022 1:08 PM Scope Withdrawal Time: 0 hours 9 minutes 23 seconds  Total Procedure Duration: 0 hours 10 minutes 50 seconds  Estimated Blood Loss:  Estimated blood loss: none.      [facility]

## 2022-03-12 ENCOUNTER — Encounter: Payer: Self-pay | Admitting: Gastroenterology

## 2022-03-13 ENCOUNTER — Telehealth: Payer: Self-pay

## 2022-03-13 LAB — SURGICAL PATHOLOGY

## 2022-03-13 NOTE — Telephone Encounter (Signed)
Called patient but was not able to leave him a voicemail since his voicemail was full. However, patient already has an appointment on 03/24/22 for his second hemorrhoid banding.

## 2022-03-13 NOTE — Telephone Encounter (Signed)
-----   Message from Jonathon Bellows, MD sent at 03/12/2022  9:59 AM EDT ----- Regarding: please arrange appointment  Needs hemorroidal banding before I go on vacationm     Dr Jonathon Bellows  Gastroenterology/Hepatology Pager: [phone]

## 2022-03-16 ENCOUNTER — Encounter: Payer: Self-pay | Admitting: Gastroenterology

## 2022-03-24 ENCOUNTER — Encounter: Payer: Self-pay | Admitting: Gastroenterology

## 2022-03-24 ENCOUNTER — Other Ambulatory Visit: Payer: Self-pay

## 2022-03-24 ENCOUNTER — Ambulatory Visit (INDEPENDENT_AMBULATORY_CARE_PROVIDER_SITE_OTHER): Payer: BC Managed Care – PPO | Admitting: Gastroenterology

## 2022-03-24 VITALS — BP 135/92 | HR 70 | Temp 97.7°F | Wt 228.6 lb

## 2022-03-24 DIAGNOSIS — K625 Hemorrhage of anus and rectum: Secondary | ICD-10-CM | POA: Diagnosis not present

## 2022-03-24 DIAGNOSIS — Z860100 Personal history of colon polyps, unspecified: Secondary | ICD-10-CM

## 2022-03-24 DIAGNOSIS — R140 Abdominal distension (gaseous): Secondary | ICD-10-CM

## 2022-03-24 DIAGNOSIS — K648 Other hemorrhoids: Secondary | ICD-10-CM

## 2022-03-24 NOTE — Patient Instructions (Signed)
Charcoal tablets or capsules What is this medication? CHARCOAL (CHAR kole) is a dietary supplement. It is used to absorb gases in the stomach that cause stomach gas. Do not use this supplement to treat poisonings or overdose. The FDA has not approved this supplement for any medical use. This medicine may be used for other purposes; ask your health care provider or pharmacist if you have questions. COMMON BRAND NAME(S): Charcoal Plus DS, CharcoCap, CharcoCaps Anti-Gas What should I tell my care team before I take this medication? They need to know if you have any of these conditions: food or medicine poisoning have frequent heartburn or gas have recently traveled to another country stomach or intestinal disease an unusual or allergic reaction to charcoal, other medicines, foods, dyes, or preservatives pregnant or trying to get pregnant breast-feeding How should I use this medication? Take by mouth with a glass of water. Follow the directions on the package label or use as directed by a health care provider. Do not take this medicine more often than directed. Talk to your pediatrician regarding the use of this medicine in children. Special care may be needed. Overdosage: If you think you have taken too much of this medicine contact a poison control center or emergency room at once. NOTE: This medicine is only for you. Do not share this medicine with others. What if I miss a dose? If you miss a dose, take it as soon as you can. If it is almost time for your next dose, take only that dose. Do not take double or extra doses. What may interact with this medication? Do not take this medicine with any of the following medications: ipecac This medicine may also interact with the following medications: acarbose aripiprazole birth control pills carbamazepine dapsone digoxin olanzapine phenothiazines like chlorpromazine, mesoridazine, prochlorperazine, thioridazine phenytoin pindolol some  herbal medicines or dietary supplements theophylline ursodeoxycholic acid This list may not describe all possible interactions. Give your health care provider a list of all the medicines, herbs, non-prescription drugs, or dietary supplements you use. Also tell them if you smoke, drink alcohol, or use illegal drugs. Some items may interact with your medicine. What should I watch for while using this medication? Tell your doctor or healthcare professional if your symptoms do not start to get better or if they get worse. See your doctor if your symptoms last for 3 days. Do not use this medicine to treat a poisoning or overdose. Get emergency help. This medicine may bind to other medicines or dairy products in the stomach. Do not take any other medicines for at least 2 hours before or after taking this medicine. Do not eat or drink milk, cheese, or other diary for at least 2 hours before or after taking this medicine. Herbal or dietary supplements are not regulated like medicines. Rigid quality control standards are not required for dietary supplements. The purity and strength of these products can vary. The safety and effect of this dietary supplement for a certain disease or illness is not well known. This product is not intended to diagnose, treat, cure or prevent any disease. The Food and Drug Administration suggests the following to help consumers protect themselves: Always read product labels and follow directions. Natural does not mean a product is safe for humans to take. Look for products that include USP after the ingredient name. This means that the manufacturer followed the standards of the [place] Pharmacopoeia. Supplements made or sold by a nationally known food or drug company are more  likely to be made under tight controls. You can write to the company for more information about how the product was made. What side effects may I notice from receiving this medication? Side effects that you should  report to your doctor or health care professional as soon as possible: allergic reactions like skin rash, itching or hives, swelling of the face, lips, or tongue Side effects that usually do not require medical attention (report to your doctor or health care professional if they continue or are bothersome): constipation dark stools dark tongue diarrhea or vomiting This list may not describe all possible side effects. Call your doctor for medical advice about side effects. You may report side effects to FDA at 1-800-FDA-1088. Where should I keep my medication? Keep out of the reach of children. Store at room temperature between 15 and 30 degrees C (59 and 86 degrees F). Protect from heat and moisture. Keep tightly closed. Throw away any unused medicine after the expiration date. NOTE: This sheet is a summary. It may not cover all possible information. If you have questions about this medicine, talk to your doctor, pharmacist, or health care provider.  2023 Elsevier/Gold Standard (2019-12-21 00:00:00) Low-Fiber Eating Plan Fiber is found in fruits, vegetables, whole grains, and beans. Eating a diet low in fiber helps to reduce how often you have bowel movements and the amount of stool you produce. A low-fiber eating plan may help your digestive system heal if you: Have certain conditions, such as Crohn's disease, diverticulitis, or irritable bowel syndrome (IBS), and are having a flare-up. Recently had radiation therapy on your pelvis or bowel. Recently had intestinal surgery. Have a new surgical opening in your abdomen (colostomy or ileostomy). Have an intestine that has narrowed (stricture). Your health care provider will tell you how long to stay on this diet and may recommend that you work with a dietitian. What are tips for following this plan? Reading food labels  Check the nutrition facts label on food products for the amount of dietary fiber. Choose foods that have less than 2 grams  (g) of fiber per serving. Cooking Use white flour for baking and cooking. Cook meat using methods that keep it tender, such as braising or poaching. Cook eggs until the yolk is completely solid. Cook with healthy oils, such as olive oil or canola oil. Meal planning Eat 5-6 small meals throughout the day instead of 3 large meals. If you are lactose intolerant: Choose low-lactose dairy foods. Do not eat dairy foods if told by your health care provider or dietitian. Limit fats and oils to less than 8 teaspoons (39 mL) a day. Eat small portions of desserts. Limit acidic, spicy, or fried foods to reduce gas, bloating, and discomfort. General information Follow instructions from your health care provider or dietitian about how much fiber you should have each day. Most people on a low-fiber eating plan should eat less than 10 g of fiber a day. Your daily fiber goal is _________________ g. Take vitamin and mineral supplements as told by your health care provider or dietitian. Chewable or liquid forms are best when on this eating plan. A gummy vitamin is not recommended. What foods should I eat? Fruits Soft-cooked or canned fruits without skin and seeds. Ripe banana. Applesauce. Fruit juice without pulp. Vegetables Well-cooked or canned vegetables without skin, seeds, or stems. Cooked potatoes without skins. Vegetable juice. Grains All bread and crackers made with white flour. Waffles, pancakes, and [place] toast. Bagels. Pretzels. Melba toast, zwieback, and  matzoh. Cooked and dried cereals that do not have whole grains, added fiber, seeds, or dried fruit. Domenick Gong. Hot and cold cereals made with refined corn, rice, or oats. Plain pasta and noodles. White rice. Meats and other proteins Ground meat. Tender cuts of meat or poultry. Eggs. Fish, seafood, and shellfish. Smooth nut butters. Tofu. Dairy All milk products and drinks. Lactose-free milk, including rice, soy, and almond milk. Yogurt without  fruit, nuts, chocolate, or granola mixed in. Sour cream. Cottage cheese. Cheese. Fats and oils Olive oil, canola oil, sunflower oil, flaxseed oil, avocado oil, and grapeseed oil. Mayonnaise. Cream cheese. Margarine. Butter. Beverages Decaf coffee. Fruit and vegetable juices. Smoothies (in small amounts, with no pulp or skins, and with fruits from the recommended list). Sports drinks. Herbal tea. Water. Sweets and desserts Plain cakes. Cookies. Cream pies and pies made with recommended fruits. Pudding. Custard. Fruit gelatin. Sherbet. Ice pops. Ice cream without nuts. Hard candy. Honey. Jelly. Molasses. Syrups. Chocolate. Marshmallows. Gumdrops. Seasonings and condiments Ketchup. Mild mustard. Mild salad dressings. Plain gravies. Vinegar. Spices in moderation. Salt. Sugar. Other foods Bouillon. Broth. Cream and strained soups made from recommended foods. Casseroles made with recommended foods. The items listed above may not be a complete list of foods and beverages you can eat. Contact a dietitian for more information. What foods should I avoid? Fruits Raw or dried fruit. Berries. Fruit juice with pulp. Prune juice. Vegetables Potato skins. Raw or undercooked vegetables. All beans and bean sprouts. Cooked greens. Corn. Peas. Cabbage. Beets. Broccoli. Brussels sprouts. Cauliflower. Mushrooms. Onions. Peppers. Parsnips. Okra. Sauerkraut. Grains Whole-wheat, whole-grain, or multigrain breads, cereals, or crackers. Rye bread. Cereals with nuts, raisins, or coconut. Bran. Granola. High-fiber cereals. Cornmeal or corn bread. Whole-grain pasta. Wild or brown rice. Quinoa. Popcorn. Buckwheat. Wheat germ. Meats and other proteins Tough, fibrous meats with gristle. Fatty meat. Poultry with skin. Fried meat, Sales executive, or fish. Precooked or cured meat, such as sausages or meat loaves. Berniece Salines. Hot dogs. Nuts and chunky nut butter. Dried peas, beans, and lentils. Hummus. Dairy Yogurt with fruit, nuts,  chocolate, or granola mixed in. Full-fat dairy such as whole milk, ice cream, or sour cream. Beverages Caffeinated coffee and teas. Fats and oils Avocado. Coconut. Butter. Sweets and desserts Desserts, cookies, or candies that contain nuts or coconut. Dried fruit. Jams and preserves with seeds. Marmalade. Any dessert made with fruits or grains that are not recommended. Seasonings and condiments Relish. Horseradish. Angie Fava. Olives. Other foods Corn tortilla chips. Soups made with vegetables or grains that are not recommended. The items listed above may not be a complete list of foods and beverages you should avoid. Contact a dietitian for more information. Summary Most people on a low-fiber eating plan should eat less than 10 grams of fiber a day. Follow recommendations from your health care provider or dietitian about how much fiber you should have each day. Always check nutrition facts labels to see the dietary fiber amount in packaged foods. A low-fiber food will have less than 2 grams of fiber per serving. Try to avoid whole grains, raw fruits and vegetables, dried fruit, tough cuts of meat, nuts, and seeds. Take a vitamin and mineral supplement as told by your health care provider or dietitian. This information is not intended to replace advice given to you by your health care provider. Make sure you discuss any questions you have with your health care provider. Document Revised: 12/14/2019 Document Reviewed: 12/14/2019 Elsevier Patient Education  [place]. How to Take a  Sitz Bath A sitz bath is a warm water bath that may be used to care for your rectum, genital area, or the area between your rectum and genitals (perineum). In a sitz bath, the water only comes up to your hips and covers your buttocks. A sitz bath may be done in a bathtub or with a portable sitz bath that fits over the toilet. Your health care provider may recommend a sitz bath to help: Relieve pain and  discomfort after delivering a baby. Relieve pain and itching from hemorrhoids or anal fissures. Relieve pain after certain surgeries. Relax muscles that are sore or tight. How to take a sitz bath Take 3-4 sitz baths a day, or as many as told by your health care provider. Bathtub sitz bath To take a sitz bath in a bathtub: Partially fill a bathtub with warm water. The water should be deep enough to cover your hips and buttocks when you are sitting in the tub. Follow your health care provider's instructions if you are told to put medicine in the water. Sit in the water. Open the tub drain a little, and leave it open during your bath. Turn on the warm water again, enough to replace the water that is draining out. Keep the water running throughout your bath. This helps keep the water at the right level and temperature. Soak in the water for 15-20 minutes, or as long as told by your health care provider. When you are done, be careful when you stand up. You may feel dizzy. After the sitz bath, pat yourself dry. Do not rub your skin to dry it.  Over-the-toilet sitz bath To take a sitz bath with an over-the-toilet basin: Follow the manufacturer's instructions. Fill the basin with warm water. Follow your health care provider's instructions if you were told to put medicine in the water. Sit on the seat. Make sure the water covers your buttocks and perineum. Soak in the water for 15-20 minutes, or as long as told by your health care provider. After the sitz bath, pat yourself dry. Do not rub your skin to dry it. Clean and dry the basin between uses. Discard the basin if it cracks, or according to the manufacturer's instructions.  Contact a health care provider if: Your pain or itching gets worse. Do not continue with sitz baths if your symptoms get worse. You have new symptoms. Do not continue with sitz baths until you talk with your health care provider. Summary A sitz bath is a warm water bath  in which the water only comes up to your hips and covers your buttocks. A sitz bath may help relieve pain and discomfort after delivering a baby. It also may help with pain and itching from hemorrhoids or anal fissures, or pain after certain surgeries. It can also help to relax muscles that are sore or tight. Take 3-4 sitz baths a day, or as many as told by your health care provider. Soak in the water for 15-20 minutes. Do not continue with sitz baths if your symptoms get worse. This information is not intended to replace advice given to you by your health care provider. Make sure you discuss any questions you have with your health care provider. Document Revised: 04/23/2020 Document Reviewed: 04/25/2020 Elsevier Patient Education  [place]. Hemorrhoids Hemorrhoids are swollen veins in and around the rectum or anus. There are two types of hemorrhoids: Internal hemorrhoids. These occur in the veins that are just inside the rectum. They may poke  through to the outside and become irritated and painful. External hemorrhoids. These occur in the veins that are outside the anus and can be felt as a painful swelling or hard lump near the anus. Most hemorrhoids do not cause serious problems, and they can be managed with home treatments such as diet and lifestyle changes. If home treatments do not help the symptoms, procedures can be done to shrink or remove the hemorrhoids. What are the causes? This condition is caused by increased pressure in the anal area. This pressure may result from various things, including: Constipation. Straining to have a bowel movement. Diarrhea. Pregnancy. Obesity. Sitting for long periods of time. Heavy lifting or other activity that causes you to strain. Anal sex. Riding a bike for a long period of time. What are the signs or symptoms? Symptoms of this condition include: Pain. Anal itching or irritation. Rectal bleeding. Leakage of stool (feces). Anal  swelling. One or more lumps around the anus. How is this diagnosed? This condition can often be diagnosed through a visual exam. Other exams or tests may also be done, such as: An exam that involves feeling the rectal area with a gloved hand (digital rectal exam). An exam of the anal canal that is done using a small tube (anoscope). A blood test, if you have lost a significant amount of blood. A test to look inside the colon using a flexible tube with a camera on the end (sigmoidoscopy or colonoscopy). How is this treated? This condition can usually be treated at home. However, various procedures may be done if dietary changes, lifestyle changes, and other home treatments do not help your symptoms. These procedures can help make the hemorrhoids smaller or remove them completely. Some of these procedures involve surgery, and others do not. Common procedures include: Rubber band ligation. Rubber bands are placed at the base of the hemorrhoids to cut off their blood supply. Sclerotherapy. Medicine is injected into the hemorrhoids to shrink them. Infrared coagulation. A type of light energy is used to get rid of the hemorrhoids. Hemorrhoidectomy surgery. The hemorrhoids are surgically removed, and the veins that supply them are tied off. Stapled hemorrhoidopexy surgery. The surgeon staples the base of the hemorrhoid to the rectal wall. Follow these instructions at home: Eating and drinking  Eat foods that have a lot of fiber in them, such as whole grains, beans, nuts, fruits, and vegetables. Ask your health care provider about taking products that have added fiber (fiber supplements). Reduce the amount of fat in your diet. You can do this by eating low-fat dairy products, eating less red meat, and avoiding processed foods. Drink enough fluid to keep your urine pale yellow. Managing pain and swelling  Take warm sitz baths for 20 minutes, 3-4 times a day to ease pain and discomfort. You may do this  in a bathtub or using a portable sitz bath that fits over the toilet. If directed, apply ice to the affected area. Using ice packs between sitz baths may be helpful. Put ice in a plastic bag. Place a towel between your skin and the bag. Leave the ice on for 20 minutes, 2-3 times a day. General instructions Take over-the-counter and prescription medicines only as told by your health care provider. Use medicated creams or suppositories as told. Get regular exercise. Ask your health care provider how much and what kind of exercise is best for you. In general, you should do moderate exercise for at least 30 minutes on most days of the  week (150 minutes each week). This can include activities such as walking, biking, or yoga. Go to the bathroom when you have the urge to have a bowel movement. Do not wait. Avoid straining to have bowel movements. Keep the anal area dry and clean. Use wet toilet paper or moist towelettes after a bowel movement. Do not sit on the toilet for long periods of time. This increases blood pooling and pain. Keep all follow-up visits as told by your health care provider. This is important. Contact a health care provider if you have: Increasing pain and swelling that are not controlled by treatment or medicine. Difficulty having a bowel movement, or you are unable to have a bowel movement. Pain or inflammation outside the area of the hemorrhoids. Get help right away if you have: Uncontrolled bleeding from your rectum. Summary Hemorrhoids are swollen veins in and around the rectum or anus. Most hemorrhoids can be managed with home treatments such as diet and lifestyle changes. Taking warm sitz baths can help ease pain and discomfort. In severe cases, procedures or surgery can be done to shrink or remove the hemorrhoids. This information is not intended to replace advice given to you by your health care provider. Make sure you discuss any questions you have with your health  care provider. Document Revised: 02/19/2021 Document Reviewed: 02/19/2021 Elsevier Patient Education  [place].

## 2022-03-24 NOTE — Progress Notes (Signed)
Shawn Bellows MD, MRCP(U.K) [address]  [place], [place] [address]  Main: [phone]  Fax: [phone]   Primary Care Physician: Valera Castle, MD  Primary Gastroenterologist:  Dr. Jonathon Stevenson   Chief Complaint  Patient presents with   Gastroesophageal Reflux    HPI: Shawn Stevenson is a 31 y.o. male   Summary of history : Initially referred and seen on 02/26/2022 for right red blood per rectum.  This occurred while wiping. A CT scan of the abdomen showed bilateral renal cysts of various sizes suggestive of adult polycystic kidney disease otherwise no acute findings advised to follow-up with gastroenterology his hemoglobin was 16.4 g with a CMP showed no abnormalities except an elevated sugar of 111.   Interval history   02/26/2022-03/24/2022 03/05/2022: Hemoglobin 15.5 g. 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  Biopsies of stomach showed focal chronic gastritis Since his last visit he still has occasional rectal bleeding with blood on the tissue paper having some bloating he consumes artificial sugars in his diet.  Denies any constipation.   Current Outpatient Medications  Medication Sig Dispense Refill   citalopram (CELEXA) 20 MG tablet Take 1 tablet by mouth daily.     omeprazole (PRILOSEC) 40 MG capsule Take 1 capsule (40 mg total) by mouth in the morning and at bedtime. 180 capsule 0   ondansetron (ZOFRAN-ODT) 4 MG disintegrating tablet Take 1 tablet by mouth every 8 (eight) hours as needed.     sucralfate (CARAFATE) 1 g tablet Take 1 tablet by mouth.     No current facility-administered medications for this visit.    Allergies as of 03/24/2022 - Review Complete 03/24/2022  Allergen Reaction Noted   Other Other (See Comments) 01/02/2016    ROS:  General: Negative for anorexia, weight loss, fever, chills, fatigue, weakness. ENT:  Negative for hoarseness, difficulty swallowing , nasal congestion. CV: Negative for chest pain, angina, palpitations, dyspnea on exertion, peripheral edema.  Respiratory: Negative for dyspnea at rest, dyspnea on exertion, cough, sputum, wheezing.  GI: See history of present illness. GU:  Negative for dysuria, hematuria, urinary incontinence, urinary frequency, nocturnal urination.  Endo: Negative for unusual weight change.    Physical Examination:   BP (!) 135/92   Pulse 70   Temp 97.7 F (36.5 C) (Oral)   Wt 228 lb 9.6 oz (103.7 kg)   BMI 31.00 kg/m   General: Well-nourished, well-developed in no acute distress.  Eyes: No icterus. Conjunctivae pink. Mouth: Oropharyngeal mucosa moist and pink , no lesions erythema or exudate. Neuro: Alert and oriented x 3.  Grossly intact. Skin: Warm and dry, no jaundice.   Psych: Alert and cooperative, normal mood and affect.   Imaging Studies: No results found.  Assessment and Plan:   Shawn Stevenson is a 31 y.o. y/o male initially referred to see me for rectal bleeding performed a colonoscopy and he had tubular adenomas which were resected.  Also found to have large internal hemorrhoids.  I explained that the bloating could be due to consumption of artificial sugars to consider stopping that and trying activated charcoal tablets as needed.  I will also give patient information on low FODMAP diet.  In terms of hemorrhoidal bleeding we talked about conservative management patient information has been provided if it does not work he has been advised to contact my office through MyChart to schedule banding  of his internal hemorrhoids at the office.     Dr Shawn Bellows  MD,MRCP [facility]) Follow up in as needed

## 2022-05-19 ENCOUNTER — Other Ambulatory Visit: Payer: Self-pay | Admitting: Gastroenterology

## 2022-05-19 DIAGNOSIS — R1084 Generalized abdominal pain: Secondary | ICD-10-CM

## 2022-05-19 DIAGNOSIS — K625 Hemorrhage of anus and rectum: Secondary | ICD-10-CM

## 2022-12-05 IMAGING — CT CT ABD-PELV W/ CM
2 of 4 series · 14 of 46 positions shown, 16 images · IV contrast (APPLIED)
Comparison: None Available.

CLINICAL DATA: Abdominal pain, bright red blood per rectum for 3
weeks

EXAM:
CT ABDOMEN AND PELVIS WITH CONTRAST
TECHNIQUE: Multidetector CT imaging of the abdomen and pelvis was performed
using the standard protocol following bolus administration of
intravenous contrast.

[Series 2: abdomen 5.0 · axial · 0.73mm/px · z∈[-1259,-724]mm · 11 of 121 slices shown, 13 images]
[im 7/121  soft-tissue]
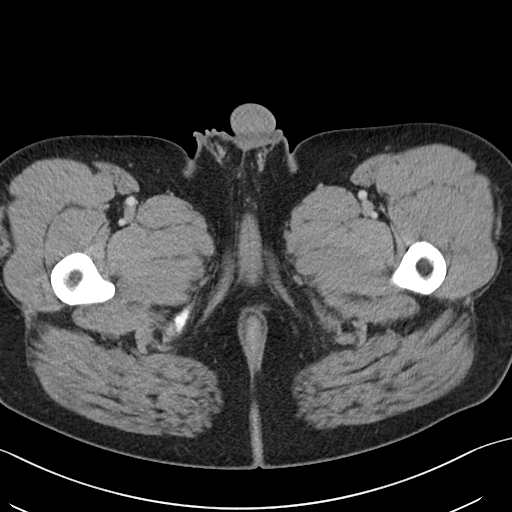
[im 7/121  bone]
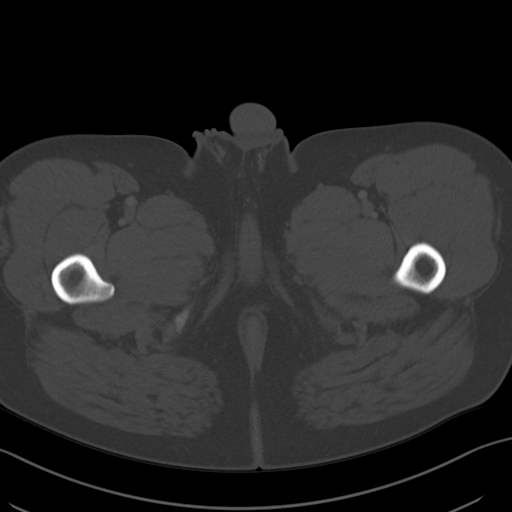
[im 21/121  soft-tissue]
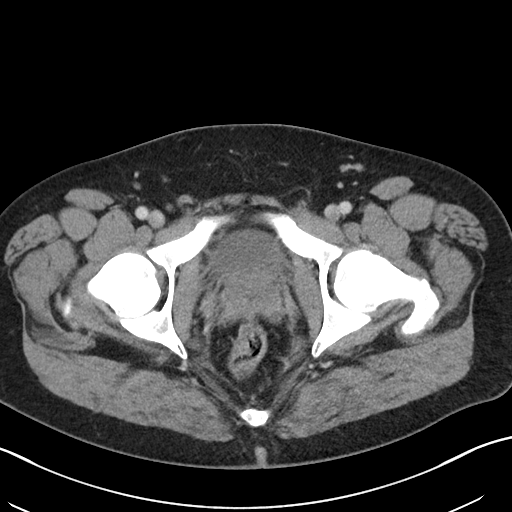
[im 27/121  soft-tissue]
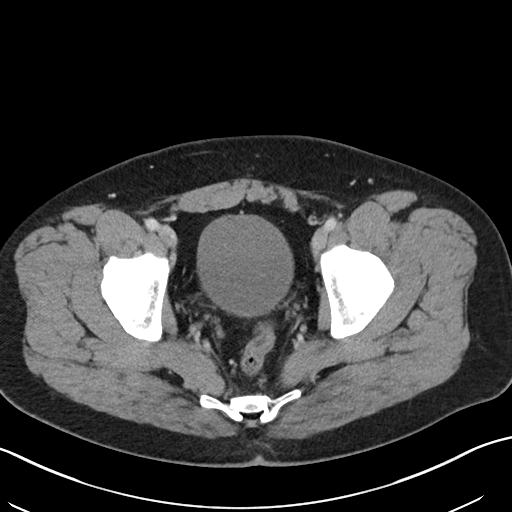
[im 41/121  soft-tissue]
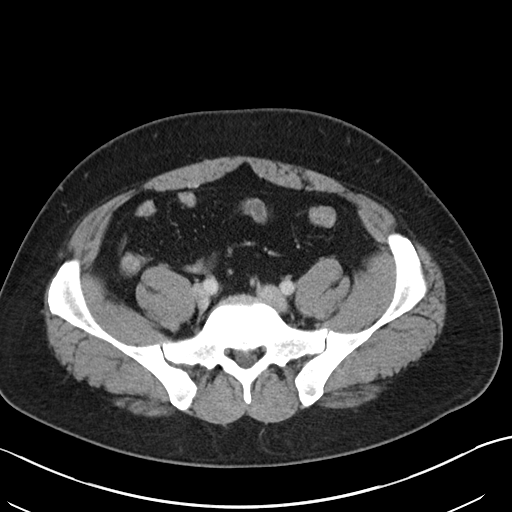
[im 47/121  soft-tissue]
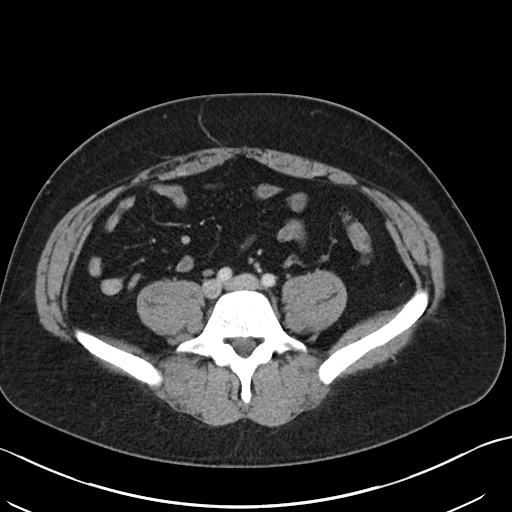
[im 61/121  soft-tissue]
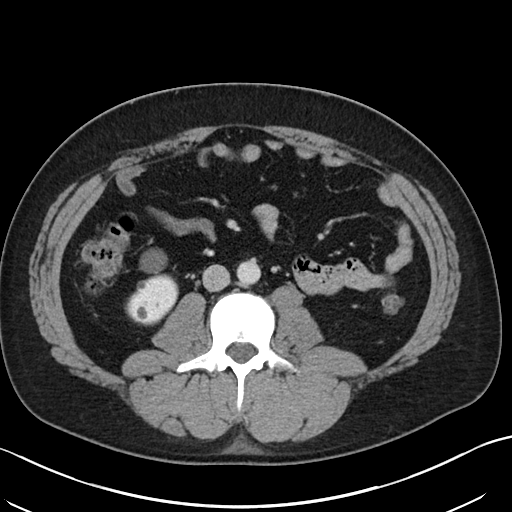
[im 74/121  soft-tissue]
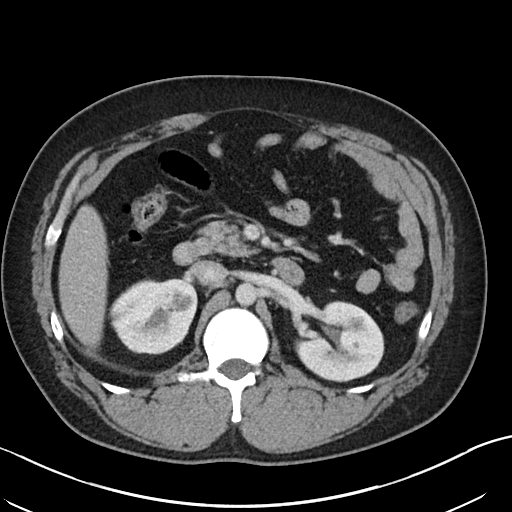
[im 81/121  soft-tissue]
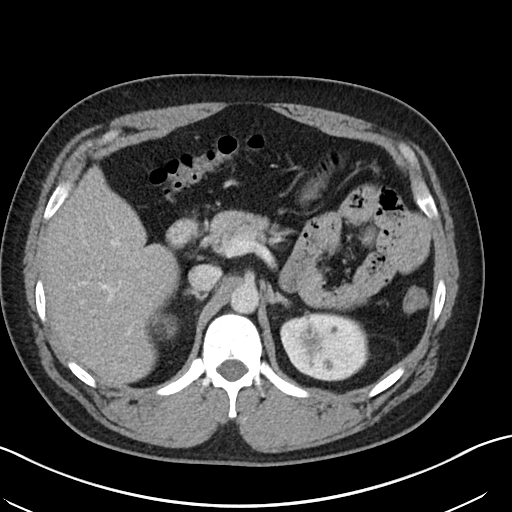
[im 94/121  soft-tissue]
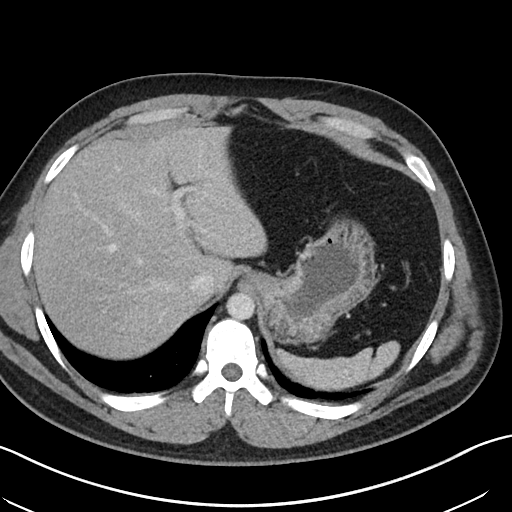
[im 94/121  bone]
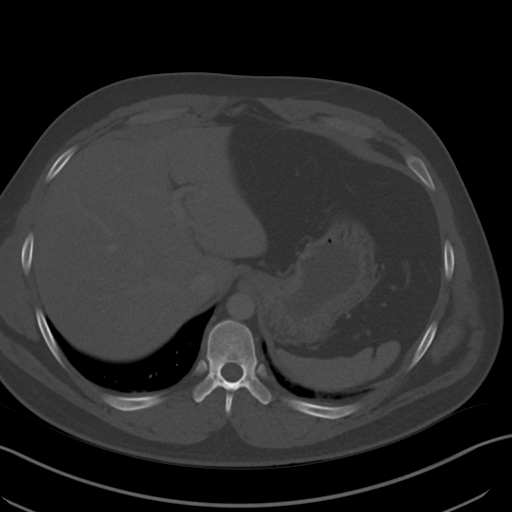
[im 101/121  soft-tissue]
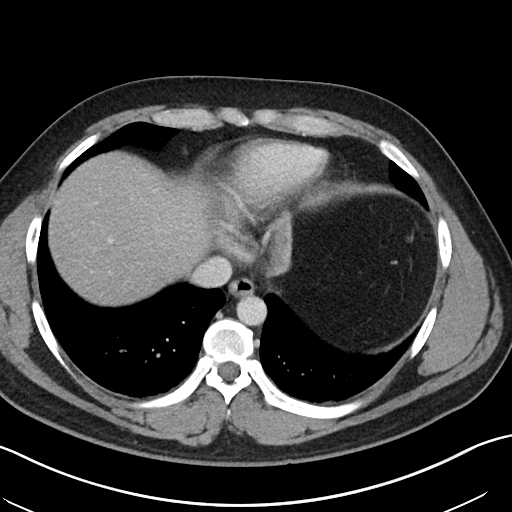
[im 114/121  soft-tissue]
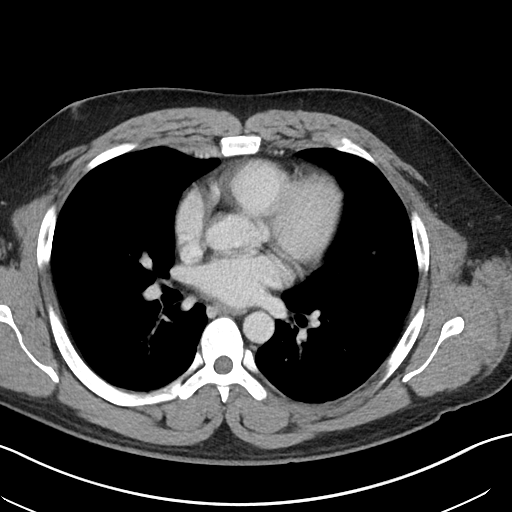

[Series 5: abdomen 3.0 mpr cor · coronal · 0.75mm/px · 3 of 99 slices shown]
[im 33/99  soft-tissue]
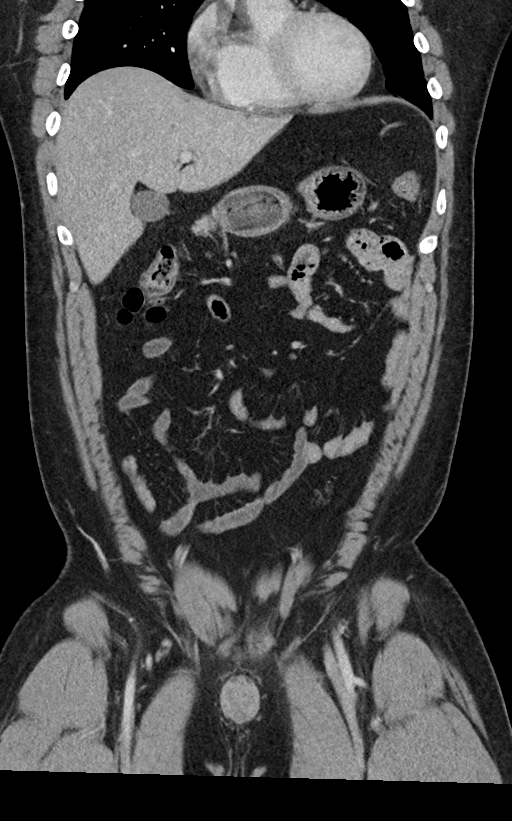
[im 44/99  soft-tissue]
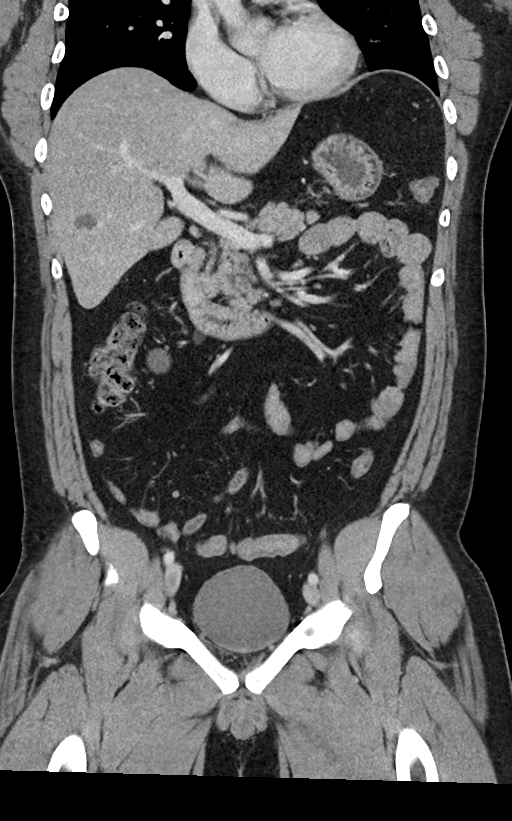
[im 55/99  soft-tissue]
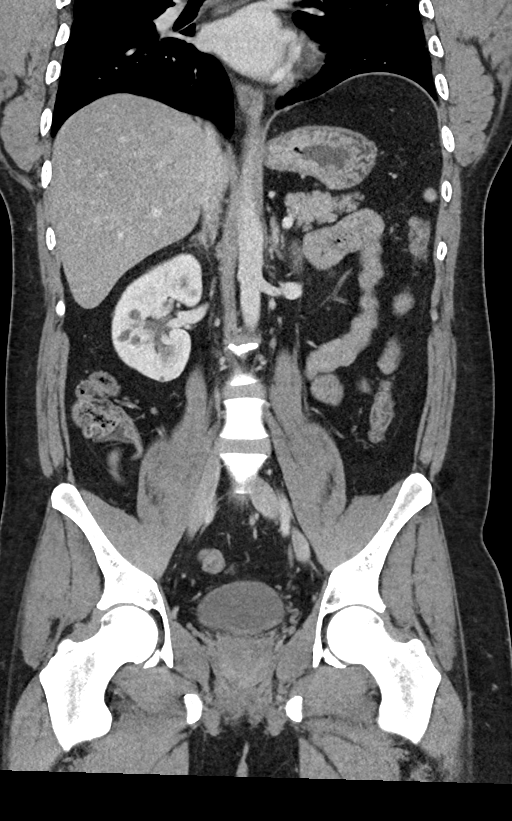

[14 of 46 positions shown; findings below may reference images not displayed]

RADIATION DOSE REDUCTION: This exam was performed according to the
departmental dose-optimization program which includes automated
exposure control, adjustment of the mA and/or kV according to
patient size and/or use of iterative reconstruction technique.

CONTRAST:  100mL OMNIPAQUE IOHEXOL 300 MG/ML  SOLN
FINDINGS: Lower chest: No acute abnormality.

Hepatobiliary: No solid liver abnormality is seen. Small liver
lesions, largest in the right lobe clearly a simple cyst, others too
small to characterize although almost certainly additional
subcentimeter cysts. No further follow-up or characterization is
required. No gallstones, gallbladder wall thickening, or biliary
dilatation.

Pancreas: Unremarkable. No pancreatic ductal dilatation or
surrounding inflammatory changes.

Spleen: Normal in size without significant abnormality.

Adrenals/Urinary Tract: Adrenal glands are unremarkable. Numerous
bilateral renal cysts of varying sizes, largest of which are clearly
simple fluid attenuation, others too small to confidently
characterize although almost certainly additional benign cysts. No
further follow-up or characterization is required. Kidneys are
otherwise normal, without renal calculi, solid lesion, or
hydronephrosis. Bladder is unremarkable.

Stomach/Bowel: Stomach is within normal limits. Appendix appears
normal. No evidence of bowel wall thickening, distention, or
inflammatory changes.

Vascular/Lymphatic: No significant vascular findings are present. No
enlarged abdominal or pelvic lymph nodes.

Reproductive: No mass or other significant abnormality.

Other: No abdominal wall hernia or abnormality. No ascites.

Musculoskeletal: No acute or significant osseous findings.
IMPRESSION: 1. No acute CT findings of the abdomen or pelvis to explain
abdominal pain or bright red blood per rectum.
2. Numerous bilateral renal cysts of varying sizes, largest of which
are clearly simple fluid attenuation, others too small to
confidently characterize although almost certainly additional benign
cysts. Although benign, number of cysts at patient age suggests
adult polycystic kidney disease.

## 2024-02-03 ENCOUNTER — Ambulatory Visit
Admission: EM | Admit: 2024-02-03 | Discharge: 2024-02-03 | Disposition: A | Discharge status: Home / Self Care | Attending: Emergency Medicine | Admitting: Emergency Medicine

## 2024-02-03 DIAGNOSIS — J069 Acute upper respiratory infection, unspecified: Secondary | ICD-10-CM | POA: Diagnosis not present

## 2024-02-03 LAB — POCT RAPID STREP A (OFFICE): Rapid Strep A Screen: NEGATIVE

## 2024-02-03 MED ORDER — PROMETHAZINE-DM 6.25-15 MG/5ML PO SYRP: 5.0000 mL | ORAL_SOLUTION | Freq: Every evening | ORAL | 0 refills | Status: AC | PRN

## 2024-02-03 MED ORDER — BENZONATATE 100 MG PO CAPS: 100.0000 mg | ORAL_CAPSULE | Freq: Three times a day (TID) | ORAL | 0 refills | Status: AC

## 2024-02-03 NOTE — ED Triage Notes (Signed)
 Sore throat, congestion, headache, cough, possible fever pt states he felt hot, x 2 days. Taking tylenol  and coccidian.    Pt states he was tested for covid, flu and rsv and all was negative.

## 2024-02-03 NOTE — ED Provider Notes (Signed)
 Shawn Stevenson    CSN: [id number] Arrival date & time: 02/03/24  1514      History   Chief Complaint Chief Complaint  Patient presents with   Sore Throat   Cough    HPI Shawn Stevenson is a 33 y.o. male.   Patient presents for evaluation of subjective fever, nasal congestion, rhinorrhea, sore throat, intermittent headaches, shortness of breath only with coughing and diarrhea beginning 2 days ago.  Cough is productive has caused episodes of vomiting, last occurrence this morning.  Able to tolerate food and liquids but appetite is decreased.  No known sick contacts prior.  Has attempted use of Vicks vapor rub, Tylenol  and Coricidin.  Denies respiratory history, non-smoker.  Was evaluated by his PCP 1 day ago, COVID flu and RSV testing negative, per patient he was given no medicine for treatment.   History reviewed. No pertinent past medical history.  Patient Active Problem List   Diagnosis Date Noted   Peritonsillar abscess 02/15/2020   Depression 08/09/2012    Past Surgical History:  Procedure Laterality Date   COLONOSCOPY WITH PROPOFOL  N/A 03/11/2022   Procedure: COLONOSCOPY WITH PROPOFOL ;  Surgeon: Luke Salaam, MD;  Location: [facility] ENDOSCOPY;  Service: Gastroenterology;  Laterality: N/A;   ESOPHAGOGASTRODUODENOSCOPY N/A 03/11/2022   Procedure: ESOPHAGOGASTRODUODENOSCOPY (EGD);  Surgeon: Luke Salaam, MD;  Location: [facility] ENDOSCOPY;  Service: Gastroenterology;  Laterality: N/A;       Home Medications    Prior to Admission medications   Medication Sig Start Date End Date Taking? Authorizing Provider  benzonatate  (TESSALON ) 100 MG capsule Take 1 capsule (100 mg total) by mouth every 8 (eight) hours. 02/03/24  Yes Loel Betancur R, NP  olmesartan (BENICAR) 20 MG tablet Take 20 mg by mouth. 03/29/23 02/01/25 Yes [provider]  promethazine-dextromethorphan (PROMETHAZINE-DM) 6.25-15 MG/5ML syrup Take 5 mLs by mouth at bedtime as needed. 02/03/24  Yes Patrena Santalucia R,  NP  rosuvastatin (CRESTOR) 40 MG tablet Take 40 mg by mouth. 12/31/23 12/30/24 Yes [provider]  citalopram (CELEXA) 20 MG tablet Take 1 tablet by mouth daily. 02/16/22 02/16/23  [provider]  olmesartan (BENICAR) 5 MG tablet Take by mouth.    [provider]  omeprazole  (PRILOSEC) 40 MG capsule Take 1 capsule (40 mg total) by mouth in the morning and at bedtime. 02/26/22 05/27/22  Luke Salaam, MD  ondansetron  (ZOFRAN -ODT) 4 MG disintegrating tablet Take 1 tablet by mouth every 8 (eight) hours as needed. 03/05/22   [provider]  sucralfate (CARAFATE) 1 g tablet Take 1 tablet by mouth. 03/05/22 04/04/22  [provider]    Family History History reviewed. No pertinent family history.  Social History Social History   Tobacco Use   Smoking status: Former    Types: Cigarettes   Smokeless tobacco: Never  Vaping Use   Vaping status: Never Used  Substance Use Topics   Alcohol use: No   Drug use: Never     Allergies   Other   Review of Systems Review of Systems   Physical Exam Triage Vital Signs ED Triage Vitals  Encounter Vitals Group     BP 02/03/24 1527 135/80     Girls Systolic BP Percentile --      Girls Diastolic BP Percentile --      Boys Systolic BP Percentile --      Boys Diastolic BP Percentile --      Pulse Rate 02/03/24 1526 (!) 101     Resp 02/03/24 1526 18  Temp 02/03/24 1526 99.5 F (37.5 C)     Temp Source 02/03/24 1526 Oral     SpO2 02/03/24 1526 98 %     Weight --      Height --      Head Circumference --      Peak Flow --      Pain Score 02/03/24 1529 8     Pain Loc --      Pain Education --      Exclude from Growth Chart --    No data found.  Updated Vital Signs BP 135/80   Pulse (!) 101   Temp 99.5 F (37.5 C) (Oral)   Resp 18   SpO2 98%   Visual Acuity Right Eye Distance:   Left Eye Distance:   Bilateral Distance:    Right Eye Near:   Left Eye Near:    Bilateral Near:     Physical  Exam Constitutional:      Appearance: Normal appearance.  HENT:     Head: Normocephalic.     Right Ear: Tympanic membrane, ear canal and external ear normal.     Left Ear: Tympanic membrane, ear canal and external ear normal.     Nose: Congestion present.     Mouth/Throat:     Mouth: Mucous membranes are moist.     Pharynx: Oropharynx is clear. Posterior oropharyngeal erythema present. No oropharyngeal exudate.   Eyes:     Extraocular Movements: Extraocular movements intact.    Cardiovascular:     Rate and Rhythm: Normal rate and regular rhythm.     Pulses: Normal pulses.     Heart sounds: Normal heart sounds.  Pulmonary:     Effort: Pulmonary effort is normal.     Breath sounds: Normal breath sounds.   Musculoskeletal:     Cervical back: Normal range of motion and neck supple.  Lymphadenopathy:     Cervical: Cervical adenopathy present.   Neurological:     Mental Status: He is alert and oriented to person, place, and time. Mental status is at baseline.      UC Treatments / Results  Labs (all labs ordered are listed, but only abnormal results are displayed) Labs Reviewed  POCT RAPID STREP A (OFFICE) - Normal    EKG   Radiology No results found.  Procedures Procedures (including critical care time)  Medications Ordered in UC Medications - No data to display  Initial Impression / Assessment and Plan / UC Course  I have reviewed the triage vital signs and the nursing notes.  Pertinent labs & imaging results that were available during my care of the patient were reviewed by me and considered in my medical decision making (see chart for details).  Viral URI with cough  Patient is in no signs of distress nor toxic appearing.  Vital signs are stable.  Low suspicion for pneumonia, pneumothorax or bronchitis and therefore will defer imaging.  Strep test negative, will not repeat viral testing, completed 1 day of prior, negative per chart review.  Prescribed  Tessalon  and Promethazine DM.May use additional over-the-counter medications as needed for supportive care.  May follow-up with urgent care as needed if symptoms persist or worsen.  Note given.   Final Clinical Impressions(s) / UC Diagnoses   Final diagnoses:  Viral URI with cough     Discharge Instructions      Your symptoms today are most likely being caused by a virus and should steadily improve in time it can take  up to 7 to 10 days before you truly start to see a turnaround however things will get better  Strep Test is negative  You may use Tessalon  pill every 8 hours as needed for cough, may use cough syrup at bedtime to allow you to rest    You can take Tylenol  and/or Ibuprofen  as needed for fever reduction and pain relief.   For cough: honey 1/2 to 1 teaspoon (you can dilute the honey in water  or another fluid).  You can also use guaifenesin and dextromethorphan for cough. You can use a humidifier for chest congestion and cough.  If you don't have a humidifier, you can sit in the bathroom with the hot shower running.      For sore throat: try warm salt water  gargles, cepacol lozenges, throat spray, warm tea or water  with lemon/honey, popsicles or ice, or OTC cold relief medicine for throat discomfort.   For congestion: take a daily anti-histamine like Zyrtec, Claritin, and a oral decongestant, such as pseudoephedrine.  You can also use Flonase 1-2 sprays in each nostril daily.   It is important to stay hydrated: drink plenty of fluids (water , gatorade/powerade/pedialyte, juices, or teas) to keep your throat moisturized and help further relieve irritation/discomfort.    ED Prescriptions     Medication Sig Dispense Auth. Provider   benzonatate  (TESSALON ) 100 MG capsule Take 1 capsule (100 mg total) by mouth every 8 (eight) hours. 21 capsule Javaris Wigington R, NP   promethazine-dextromethorphan (PROMETHAZINE-DM) 6.25-15 MG/5ML syrup Take 5 mLs by mouth at bedtime as needed.  118 mL Eevie Lapp, Maybelle Spatz, NP      PDMP not reviewed this encounter.   Reena Canning, NP 02/03/24 1627

## 2024-02-03 NOTE — Discharge Instructions (Signed)
 Your symptoms today are most likely being caused by a virus and should steadily improve in time it can take up to 7 to 10 days before you truly start to see a turnaround however things will get better  Strep Test is negative  You may use Tessalon  pill every 8 hours as needed for cough, may use cough syrup at bedtime to allow you to rest    You can take Tylenol  and/or Ibuprofen  as needed for fever reduction and pain relief.   For cough: honey 1/2 to 1 teaspoon (you can dilute the honey in water  or another fluid).  You can also use guaifenesin and dextromethorphan for cough. You can use a humidifier for chest congestion and cough.  If you don't have a humidifier, you can sit in the bathroom with the hot shower running.      For sore throat: try warm salt water  gargles, cepacol lozenges, throat spray, warm tea or water  with lemon/honey, popsicles or ice, or OTC cold relief medicine for throat discomfort.   For congestion: take a daily anti-histamine like Zyrtec, Claritin, and a oral decongestant, such as pseudoephedrine.  You can also use Flonase 1-2 sprays in each nostril daily.   It is important to stay hydrated: drink plenty of fluids (water , gatorade/powerade/pedialyte, juices, or teas) to keep your throat moisturized and help further relieve irritation/discomfort.
